# Patient Record
Sex: Female | Born: 1940 | Race: Asian | Hispanic: No | Marital: Married | Smoking: Never smoker
Health system: Southern US, Community
[De-identification: ages and names within clinical notes are randomized; demographics above are authoritative.]

## PROBLEM LIST (undated history)

## (undated) DIAGNOSIS — E785 Hyperlipidemia, unspecified: Secondary | ICD-10-CM

## (undated) DIAGNOSIS — E119 Type 2 diabetes mellitus without complications: Secondary | ICD-10-CM

## (undated) DIAGNOSIS — R42 Dizziness and giddiness: Secondary | ICD-10-CM

## (undated) HISTORY — DX: Type 2 diabetes mellitus without complications: E11.9

## (undated) HISTORY — DX: Dizziness and giddiness: R42

## (undated) HISTORY — DX: Hyperlipidemia, unspecified: E78.5

---

## 2016-01-27 LAB — HM COLONOSCOPY

## 2016-05-31 ENCOUNTER — Ambulatory Visit: Payer: Self-pay | Admitting: Family Medicine

## 2016-06-02 ENCOUNTER — Ambulatory Visit (INDEPENDENT_AMBULATORY_CARE_PROVIDER_SITE_OTHER): Payer: Medicare Other | Admitting: Primary Care

## 2016-06-02 ENCOUNTER — Encounter: Payer: Self-pay | Admitting: Primary Care

## 2016-06-02 VITALS — BP 122/76 | HR 77 | Temp 98.4°F | Ht 60.25 in | Wt 96.8 lb

## 2016-06-02 DIAGNOSIS — Z794 Long term (current) use of insulin: Secondary | ICD-10-CM | POA: Diagnosis not present

## 2016-06-02 DIAGNOSIS — E119 Type 2 diabetes mellitus without complications: Secondary | ICD-10-CM | POA: Diagnosis not present

## 2016-06-02 DIAGNOSIS — R42 Dizziness and giddiness: Secondary | ICD-10-CM | POA: Diagnosis not present

## 2016-06-02 DIAGNOSIS — E785 Hyperlipidemia, unspecified: Secondary | ICD-10-CM | POA: Insufficient documentation

## 2016-06-02 LAB — LIPID PANEL
CHOLESTEROL: 154 mg/dL (ref 0–200)
HDL: 68.1 mg/dL (ref >39.00)
LDL CALC: 73 mg/dL (ref 0–99)
NonHDL: 86.07
TRIGLYCERIDES: 65 mg/dL (ref 0.0–149.0)
Total CHOL/HDL Ratio: 2
VLDL: 13 mg/dL (ref 0.0–40.0)

## 2016-06-02 LAB — BASIC METABOLIC PANEL
BUN: 17 mg/dL (ref 6–23)
CO2: 29 meq/L (ref 19–32)
Calcium: 9.5 mg/dL (ref 8.4–10.5)
Chloride: 103 mEq/L (ref 96–112)
Creatinine, Ser: 0.56 mg/dL (ref 0.40–1.20)
GFR: 112.53 mL/min (ref >60.00)
GLUCOSE: 183 mg/dL — AB (ref 70–99)
POTASSIUM: 4 meq/L (ref 3.5–5.1)
Sodium: 139 mEq/L (ref 135–145)

## 2016-06-02 LAB — HEMOGLOBIN A1C: HEMOGLOBIN A1C: 7.9 % — AB (ref 4.6–6.5)

## 2016-06-02 NOTE — Assessment & Plan Note (Signed)
Intermittent, occurring twice weekly. Does not seem to be vertigo, orthostasis, dehydration. No falls. She is to notify me if the dizziness becomes more frequent or bothersome.

## 2016-06-02 NOTE — Progress Notes (Signed)
Pre visit review using our clinic review tool, if applicable. No additional management support is needed unless otherwise documented below in the visit note. 

## 2016-06-02 NOTE — Patient Instructions (Signed)
?? ???? ????? ??? ??? ? ??? ???? ??????.  ?? ?? ???? ? ? ??????.  ?? ?????   Chan ????? ??? ?? ???, ??? ?? ??? ?? ? ?? ? ????.  ??? ?? ???? ???? ??? ?? ?? ?????.  ??? ?????! [facility] [place]? ?? ?? ?????! oneul dangnyobyeong-gwa kolleseutelol suchileul hwag-inhal su issdolog silheomsil-e deulleojusibsio.  chuga yag-eul bochunghaeya hal ttae allyeojusibsio.  naneun nyuyog-eissneun Chan bagsalobuteo gilog-eul eod-eul geos-igo, najung-e dasi mannago sip-eul ttae yeonlag hal geos-ibnida.  gung-geumhan jeom-i iss-eusimyeon eonjedeunji imeil ttoneun jeonhwa haejusibsio.  mannaseo bangabseubnida! [place] osin geos-eul hwan-yeonghabnida!    Please stop by the lab today so we can check your diabetes and cholesterol levels.  Please notify me when you need additional refills of your medications.  I will obtain your records from Dr. Johny Drillinghan in [place], and will later be in touch regarding when I'd like to see you again.  Please don't hesitate to e-mail or call me with any questions.  It was very nice to meet you! [facility]!

## 2016-06-02 NOTE — Assessment & Plan Note (Signed)
Diagnosed years ago. Currently managed on the bedside XL 5, metformin 850 mg twice a day, and Humulin NPH 8 units twice a day. Endorses normal A1c in September 2016. We'll obtain records from [place]. Will also obtain an A1c, BMP, and lipids today. Managed on statin, no ace. Will obtain records in regards to immunizations.

## 2016-06-02 NOTE — Progress Notes (Signed)
   Subjective:    Patient ID: Lorne SkeensJeong Ja Serena, female    DOB: 1941/06/08, 75 y.o.   MRN: [id number]  HPI  Ms. Nedra HaiLee is a 75 year old female who presents today to establish care and discuss the problems mentioned below. Will obtain old records.She does not speak [place], her son is present and is translating.  1) Type 2 Diabetes: Diagnosed in her 50's. Currently managed on Glipizide XL 5 mg, Metformin 850 mg BID, and Humulin NPH 8 units BID. Her last blood draw was in September 2016, she endorses it was normal. Currently managed on ACE and statin.   2) Hyperlipidemia: Diagnosed years ago. Currently managed on Simvastatin 20 mg.   3) Dizziness: Occurs twice weekly on average. She will experience dizziness with both resting and with activity. She applies pressure to her temples in her head with improvement. She will also experience a sharp pain just prior to the dizziness. Her dizziness will last for a few seconds. Denies falls, doesn't feel as though the room is spinning.  Review of Systems  Constitutional: Negative for unexpected weight change.  Respiratory: Negative for shortness of breath.   Cardiovascular: Negative for chest pain.  Neurological: Negative for numbness and headaches.       Occasional dizziness       Past Medical History  Diagnosis Date  . Type 2 diabetes mellitus (HCC)   . Hyperlipidemia   . Dizziness      Social History   Social History  . Marital Status: Married    Spouse Name: N/A  . Number of Children: N/A  . Years of Education: N/A   Occupational History  . Not on file.   Social History Main Topics  . Smoking status: Never Smoker   . Smokeless tobacco: Not on file  . Alcohol Use: No  . Drug Use: No  . Sexual Activity: Not on file   Other Topics Concern  . Not on file   Social History Narrative   Moved from [place].   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   Enjoys reading books, spending time with family.    No past surgical history on  file.  Family History  Problem Relation Age of Onset  . Lung cancer Father   . Diabetes Sister     No Known Allergies  No current outpatient prescriptions on file prior to visit.   No current facility-administered medications on file prior to visit.    BP 122/76 mmHg  Pulse 77  Temp(Src) 98.4 F (36.9 C) (Oral)  Ht 5' 0.25" (1.53 m)  Wt 96 lb 12.8 oz (43.908 kg)  BMI 18.76 kg/m2  SpO2 99%    Objective:   Physical Exam  Constitutional: She is oriented to person, place, and time. She appears well-nourished.  Neck: Neck supple.  Cardiovascular: Normal rate and regular rhythm.   Pulmonary/Chest: Effort normal and breath sounds normal.  Neurological: She is alert and oriented to person, place, and time.  Skin: Skin is warm and dry.  Psychiatric: She has a normal mood and affect.          Assessment & Plan:

## 2016-06-02 NOTE — Assessment & Plan Note (Signed)
Managed on simvastatin 20 mg tablets. Continue same. We'll obtain records for last lipids, lipid panel pending today.

## 2016-06-13 DIAGNOSIS — E119 Type 2 diabetes mellitus without complications: Secondary | ICD-10-CM | POA: Diagnosis not present

## 2016-09-14 DIAGNOSIS — E113293 Type 2 diabetes mellitus with mild nonproliferative diabetic retinopathy without macular edema, bilateral: Secondary | ICD-10-CM | POA: Diagnosis not present

## 2016-10-07 ENCOUNTER — Other Ambulatory Visit: Payer: Self-pay

## 2016-10-07 MED ORDER — ONETOUCH DELICA LANCETS FINE MISC: 5 refills | Status: DC

## 2016-10-07 NOTE — Telephone Encounter (Signed)
Pts son request refill onetouch delica lancets to [facility]; pt test bid. Last seen 06/02/16. Refilled per protocol. pts son will ck with pharmacy.

## 2016-11-29 ENCOUNTER — Other Ambulatory Visit: Payer: Self-pay

## 2016-11-29 NOTE — Telephone Encounter (Signed)
Holly Trevino left v/m requesting rx for qwikpen for NPH insulin to [facility]. Pt last seen 06/02/16;was to get records from [place]. Is it OK to refill.

## 2016-11-29 NOTE — Telephone Encounter (Signed)
Holly Trevino Frankland called back and requested refill insulin to [facility] on 11/30/16.

## 2016-12-01 ENCOUNTER — Other Ambulatory Visit: Payer: Self-pay | Admitting: Primary Care

## 2016-12-01 DIAGNOSIS — E119 Type 2 diabetes mellitus without complications: Secondary | ICD-10-CM

## 2016-12-01 DIAGNOSIS — Z794 Long term (current) use of insulin: Principal | ICD-10-CM

## 2016-12-01 MED ORDER — INSULIN PEN NEEDLE 31G X 8 MM MISC: 11 refills | Status: DC

## 2016-12-01 MED ORDER — INSULIN ISOPHANE HUMAN 100 UNIT/ML KWIKPEN: PEN_INJECTOR | SUBCUTANEOUS | 11 refills | Status: DC

## 2016-12-01 NOTE — Telephone Encounter (Signed)
Please call Onalee HuaDavid at [phone] when rx is called in to pharmacy.

## 2016-12-01 NOTE — Addendum Note (Signed)
Addended by: Tawnya CrookSAMBATH, English Tomer on: 12/01/2016 03:54 PM   Modules accepted: Orders

## 2016-12-01 NOTE — Telephone Encounter (Signed)
Spoken to patient's son and was notified that patient is using Humulin kwikpen  Taking 8 units into the skin 2 times daily before meals.  Patient's read of the box that it is the kwikpen not the vial. Sorry for the confusion.

## 2016-12-01 NOTE — Telephone Encounter (Signed)
Please call patient and verify exactly which insulin he's needing. What does his current prescription read? Humulin? Novolin? From his chat it looks like he has the vial. Is he wanting the pens?

## 2016-12-01 NOTE — Telephone Encounter (Signed)
Patient's son,David,called.  He's asking about the medication.  Patient is out of medication.

## 2017-01-29 ENCOUNTER — Emergency Department (HOSPITAL_COMMUNITY)
Admission: EM | Admit: 2017-01-29 | Discharge: 2017-01-29 | Disposition: A | Discharge status: Home / Self Care | Payer: Medicare Other | Attending: Orthopedic Surgery | Admitting: Orthopedic Surgery

## 2017-01-29 ENCOUNTER — Emergency Department (HOSPITAL_COMMUNITY): Payer: Medicare Other

## 2017-01-29 ENCOUNTER — Encounter (HOSPITAL_COMMUNITY): Payer: Self-pay | Admitting: *Deleted

## 2017-01-29 DIAGNOSIS — W000XXA Fall on same level due to ice and snow, initial encounter: Secondary | ICD-10-CM | POA: Insufficient documentation

## 2017-01-29 DIAGNOSIS — S99911A Unspecified injury of right ankle, initial encounter: Secondary | ICD-10-CM | POA: Diagnosis present

## 2017-01-29 DIAGNOSIS — Z79899 Other long term (current) drug therapy: Secondary | ICD-10-CM | POA: Insufficient documentation

## 2017-01-29 DIAGNOSIS — S82401A Unspecified fracture of shaft of right fibula, initial encounter for closed fracture: Secondary | ICD-10-CM | POA: Insufficient documentation

## 2017-01-29 DIAGNOSIS — Z794 Long term (current) use of insulin: Secondary | ICD-10-CM | POA: Diagnosis not present

## 2017-01-29 DIAGNOSIS — S82201A Unspecified fracture of shaft of right tibia, initial encounter for closed fracture: Secondary | ICD-10-CM | POA: Insufficient documentation

## 2017-01-29 DIAGNOSIS — S82301A Unspecified fracture of lower end of right tibia, initial encounter for closed fracture: Secondary | ICD-10-CM

## 2017-01-29 DIAGNOSIS — Y939 Activity, unspecified: Secondary | ICD-10-CM | POA: Insufficient documentation

## 2017-01-29 DIAGNOSIS — Y999 Unspecified external cause status: Secondary | ICD-10-CM | POA: Diagnosis not present

## 2017-01-29 DIAGNOSIS — S82891A Other fracture of right lower leg, initial encounter for closed fracture: Secondary | ICD-10-CM

## 2017-01-29 DIAGNOSIS — Y929 Unspecified place or not applicable: Secondary | ICD-10-CM | POA: Insufficient documentation

## 2017-01-29 DIAGNOSIS — E119 Type 2 diabetes mellitus without complications: Secondary | ICD-10-CM | POA: Insufficient documentation

## 2017-01-29 DIAGNOSIS — S82401B Unspecified fracture of shaft of right fibula, initial encounter for open fracture type I or II: Secondary | ICD-10-CM | POA: Diagnosis not present

## 2017-01-29 DIAGNOSIS — S82201B Unspecified fracture of shaft of right tibia, initial encounter for open fracture type I or II: Secondary | ICD-10-CM | POA: Diagnosis not present

## 2017-01-29 DIAGNOSIS — S82831A Other fracture of upper and lower end of right fibula, initial encounter for closed fracture: Secondary | ICD-10-CM | POA: Diagnosis not present

## 2017-01-29 DIAGNOSIS — S82241A Displaced spiral fracture of shaft of right tibia, initial encounter for closed fracture: Secondary | ICD-10-CM | POA: Diagnosis not present

## 2017-01-29 LAB — CBG MONITORING, ED: Glucose-Capillary: 227 mg/dL — ABNORMAL HIGH (ref 65–99)

## 2017-01-29 MED ORDER — FENTANYL CITRATE (PF) 100 MCG/2ML IJ SOLN
100.0000 ug | Freq: Once | INTRAMUSCULAR | Status: DC
  Filled 2017-01-29: qty 2

## 2017-01-29 MED ORDER — HYDROCODONE-ACETAMINOPHEN 5-325 MG PO TABS: 1.0000 | ORAL_TABLET | Freq: Four times a day (QID) | ORAL | 0 refills | Status: DC | PRN

## 2017-01-29 NOTE — ED Provider Notes (Signed)
[facility]EMER[facility]ENCY DEPT Provider Note   CSN: [id number] Arrival date & time: 01/29/17  1554     History   Chief Complaint Chief Complaint  Patient presents with  . Fall  . Ankle Injury    HPI Holly Trevino is a 76 y.o. female.  HPI 76 year old female presenting after she slipped and fell on black ice around 10 AM this morning. She had sudden onset right ankle pain. She denies hitting her head or loss consciousness. She denies pain anywhere else. She first presented to [facility] and had an x-ray and was told that she had a both bone ankle fracture and needed to come here for further evaluation. She denies pain in any other extremity. Denies hip or knee pain on the right side. She denies back pain or headache or neck pain. She denies any numbness or weakness. She states she had no chest pain, shortness of breath, lightheadedness or dizziness prior to the fall.   Past Medical History:  Diagnosis Date  . Dizziness   . Hyperlipidemia   . Type 2 diabetes mellitus [facility])     Patient Active Problem List   Diagnosis Date Noted  . Type 2 diabetes mellitus without complication, with long-term current use of insulin (HCC) 06/02/2016  . Hyperlipidemia 06/02/2016  . Dizziness 06/02/2016    History reviewed. No pertinent surgical history.  OB History    No data available       Home Medications    Prior to Admission medications   Medication Sig Start Date End Date Taking? Authorizing Provider  glipiZIDE (GLUCOTROL XL) 5 MG 24 hr tablet Take 5 mg by mouth daily with breakfast.  04/26/16   Historical Provider, MD  HYDROcodone-acetaminophen (NORCO/VICODIN) 5-325 MG tablet Take 1 tablet by mouth every 6 (six) hours as needed. 01/29/17   Mieshia Pepitone [place] Rula Keniston, MD  Insulin NPH, Human,, Isophane, (HUMULIN N KWIKPEN) 100 UNIT/ML Kiwkpen Inject 8 units into the skin twice daily before meals. 12/01/16   Doreene Nest, NP  Insulin Pen Needle 31G X 8 MM MISC Used as directed to inject Humulin  insulin 2 times daily before meals. 12/01/16   Doreene Nest, NP  lisinopril (PRINIVIL,ZESTRIL) 10 MG tablet Take 10 mg by mouth daily.    Historical Provider, MD  metFORMIN (GLUCOPHAGE) 850 MG tablet Take 850 mg by mouth 2 (two) times daily with a meal.    Historical Provider, MD  [facility] DELICA LANCETS FINE MISC Check blood sugar twice a day and as directed; insulin treatment Dx E11.9 10/07/16   Doreene Nest, NP  simvastatin (ZOCOR) 20 MG tablet Take 20 mg by mouth daily.    Historical Provider, MD    Family History Family History  Problem Relation Age of Onset  . Lung cancer Father   . Diabetes Sister     Social History Social History  Substance Use Topics  . Smoking status: Never Smoker  . Smokeless tobacco: Not on file  . Alcohol use No     Allergies   Patient has no known allergies.   Review of Systems Review of Systems  Constitutional: Negative for chills and fever.  HENT: Negative for ear pain and sore throat.   Eyes: Negative for pain and visual disturbance.  Respiratory: Negative for cough and shortness of breath.   Cardiovascular: Negative for chest pain and palpitations.  Gastrointestinal: Negative for abdominal pain and vomiting.  Genitourinary: Negative for dysuria and hematuria.  Musculoskeletal: Negative for arthralgias, back pain and neck  pain.  Skin: Negative for color change and rash.  Neurological: Negative for seizures and syncope.  All other systems reviewed and are negative.    Physical Exam Updated Vital Signs BP 102/56   Pulse 90   Temp 98.5 F (36.9 C) (Oral)   Resp 16   SpO2 97%   Physical Exam  Constitutional: She is oriented to person, place, and time. She appears well-developed and well-nourished. No distress.  HENT:  Head: Normocephalic and atraumatic.  Eyes: Conjunctivae are normal.  Neck: Normal range of motion. Neck supple.  Cardiovascular: Normal rate and regular rhythm.   No murmur heard. Pulmonary/Chest: Effort  normal and breath sounds normal. No respiratory distress.  Abdominal: Soft. There is no tenderness.  Musculoskeletal: She exhibits no edema.       Right hip: She exhibits normal range of motion, no tenderness, no bony tenderness and no deformity.       Right knee: She exhibits normal range of motion and no deformity. No tenderness found.       Right ankle: She exhibits decreased range of motion, swelling and deformity. Tenderness. Lateral malleolus and medial malleolus tenderness found.       Cervical back: She exhibits no tenderness and no pain.       Thoracic back: She exhibits no tenderness and no pain.       Lumbar back: She exhibits no tenderness and no pain.  2+ DP pulses bl. Sensation and motor function intact in RLE.  Neurological: She is alert and oriented to person, place, and time. She has normal strength. No cranial nerve deficit or sensory deficit. GCS eye subscore is 4. GCS verbal subscore is 5. GCS motor subscore is 6.  Skin: Skin is warm and dry. Capillary refill takes less than 2 seconds.  Psychiatric: She has a normal mood and affect.  Nursing note and vitals reviewed.    ED Treatments / Results  Labs (all labs ordered are listed, but only abnormal results are displayed) Labs Reviewed  CBG MONITORING, ED - Abnormal; Notable for the following:       Result Value   Glucose-Capillary 227 (*)    All other components within normal limits    EKG  EKG Interpretation None       Radiology Dg Tibia/fibula Right  Result Date: 01/29/2017 CLINICAL DATA:  Right leg fractures.  Initial encounter. EXAM: RIGHT TIBIA AND FIBULA - 2 VIEW COMPARISON:  None. FINDINGS: Spiral fracture of the distal tibial diaphysis. There is questionable extension to the plafond and on contemporaneous ankle exam. Diaphysis fracture displacement is mild at up to 25% laterally. Nondisplaced coronal oblique fracture of the distal fibular diaphysis. Osteopenia. IMPRESSION: Distal tibial and fibular  diaphysis fractures as described. Electronically Signed   By: Marnee SpringJonathon  Watts M.D.   On: 01/29/2017 18:42   Dg Ankle Complete Right  Result Date: 01/29/2017 CLINICAL DATA:  Fall on ice today.  Fracture.  Initial encounter. EXAM: RIGHT ANKLE - COMPLETE 3+ VIEW COMPARISON:  None. FINDINGS: There is a spiral fracture of the distal tibial diaphysis with 25% lateral displacement. Questionable branching fracture plane extending to the plafond, without articular surface displacement. Coronal oblique fracture of the distal fibular diaphysis with mild apex ventral angulation. Osteopenia. IMPRESSION: 1. Mildly displaced spiral fracture of the distal tibial diaphysis. Questionable extension to the plafond. 2. Nondisplaced distal fibular diaphysis fracture. Electronically Signed   By: Marnee SpringJonathon  Watts M.D.   On: 01/29/2017 18:40   Ct Ankle Right Wo Contrast  Result  Date: 01/29/2017 CLINICAL DATA:  Fractures of the distal tibia and fibula after slip and fall on the ice this morning. EXAM: CT OF THE RIGHT ANKLE WITHOUT CONTRAST TECHNIQUE: Multidetector CT imaging of the right ankle was performed according to the standard protocol. Multiplanar CT image reconstructions were also generated. COMPARISON:  Right ankle 01/29/2017 FINDINGS: Bones/Joint/Cartilage Oblique fracture of the metadiaphysis of the distal right tibia with about 1 cm lateral displacement and 8 mm posterior displacement of the distal fracture fragment. There is an additional fracture line across the base of the medial malleolus of the right ankle without displacement. The fracture line extends to the tibiotalar joint surface. Mild widening of the joint space may indicate ligamentous injury. Oblique comminuted fracture of the distal right fibular shaft with extension to the tibia fibular joint. Comminuted fractures of the tip of the fibula consistent with avulsion of the lateral malleolus. Fracture lines extends to the talofibular joint. Ligaments Suboptimally  assessed by CT. Muscles and Tendons Limited evaluation. Appear grossly intact. MRI would be more suitable for evaluation of muscles and tendons. Soft tissues Diffuse soft tissue edema about the ankle. No loculated collections. IMPRESSION: Multiple fractures of the distal tibia and fibula as described above. Diffuse soft tissue edema. Electronically Signed   By: Burman Nieves M.D.   On: 01/29/2017 22:31    Procedures Procedures (including critical care time)  Medications Ordered in ED Medications  fentaNYL (SUBLIMAZE) injection 100 mcg (100 mcg Intravenous Not Given 01/29/17 1937)     Initial Impression / Assessment and Plan / ED Course  I have reviewed the triage vital signs and the nursing notes.  Pertinent labs & imaging results that were available during my care of the patient were reviewed by me and considered in my medical decision making (see chart for details).    76 year old female who slipped on black ice earlier fell and has right tib-fib fracture. Appears to be a mechanical fall. Denies any lightheadedness, dizziness, chest pain, shortness of breath prior to the fall. She hasn't had any other symptoms other than right ankle pain since then. She fell onto her bottom but denies low back or pelvic pain.  Full range of motion of the right knee and hip. Deformity to R ankle. Pulses, sensation and motor function intact. Ortho tech applied splint at the bedside Orthopedics consult for the right ankle fracture. Dr. Dion Saucier saw the pt in the ED and discussed the possibility of surgery however at this time she is declining. Dr Dion Saucier would like a CT scan of the ankle to assist his decision about possible over in the future. She will follow-up in his clinic on Wednesday. She was given crutches and she will may remain nonweightbearing to the right ankle until follow-up with orthopedics. She was given a DME prescription for a walker to assist with her nonweightbearing. Prescription for Norco given  for pain. She remained hemodynamically stable while in the emergency department. Strict return precautions given patient was discharged home in good condition into the care of her son.  Patient care discussed and supervised by my attending, Dr. Hyacinth Meeker. Azalia Bilis, MD   Final Clinical Impressions(s) / ED Diagnoses   Final diagnoses:  Closed fracture of right ankle, initial encounter    New Prescriptions New Prescriptions   HYDROCODONE-ACETAMINOPHEN (NORCO/VICODIN) 5-325 MG TABLET    Take 1 tablet by mouth every 6 (six) hours as needed.     Keya Wynes [place] Min Collymore, MD 01/29/17 1610    Eber Hong, MD 02/08/17 1014

## 2017-01-29 NOTE — Consult Note (Signed)
ORTHOPAEDIC CONSULTATION  REQUESTING PHYSICIAN: Eber HongBrian Miller, MD  Chief Complaint: Right ankle pain  HPI: Holly Trevino is a 76 y.o. female who complains of  acute right lower leg pain after she slipped on ice. [facility], had x-rays taken, then referred to this emergency room. Acute severe pain unable to bear weight, worse with movement, better with resting and splinting. She has already been splinted. Denies previous fracture. Normally walks fairly significant amount, at least daily for exercise after meals.  Past Medical History:  Diagnosis Date  . Dizziness   . Hyperlipidemia   . Type 2 diabetes mellitus (HCC)    History reviewed. No pertinent surgical history. Social History   Social History  . Marital status: Married    Spouse name: N/A  . Number of children: N/A  . Years of education: N/A   Social History Main Topics  . Smoking status: Never Smoker  . Smokeless tobacco: None  . Alcohol use No  . Drug use: No  . Sexual activity: Not Asked   Other Topics Concern  . None   Social History Narrative   Moved from [place].   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   Enjoys reading books, spending time with family.   Family History  Problem Relation Age of Onset  . Lung cancer Father   . Diabetes Sister    No Known Allergies   Positive ROS: All other systems have been reviewed and were otherwise negative with the exception of those mentioned in the HPI and as above.  Physical Exam: General: Alert, no acute distress Cardiovascular: No pedal edema Respiratory: No cyanosis, no use of accessory musculature GI: No organomegaly, abdomen is soft and non-tender Skin: No lesions in the area of chief complaint Neurologic: Sensation intact distally Psychiatric: Patient is competent for consent with normal mood and affect Lymphatic: No axillary or cervical lymphadenopathy  MUSCULOSKELETAL: Right ankle has some soft tissue swelling and pain to palpation  diffusely, did not remove her splint, sensation intact throughout her toes, rotational alignment looks fair although she may be slightly externally rotated. EHL and FHL are intact. She is nontender otherwise.  Assessment: Right distal tibia and fibula fracture, acute, with mild displacement and a 76 year old active [place] woman with risk factors including diabetes    Plan: This is an acute severe injury and threatened her long-term function. I've recommended consideration for surgical intervention although she herself is really not interested in surgery. She wants to treat this with closed management. I think that she will heal, and certainly has risk factors on considering surgery, although malunion and rotational malalignment as well as persistent valgus may be a long-term problem, however she is going to give all of this consideration.  I discussed the options of admission versus discharge home, and she really was to be discharged home. Amount of close follow-up with her on Wednesday and continue the discussion, she may elect for surgical intervention on a delayed basis in an elective fashion, however she is currently inclined towards nonsurgical management. I have discussed the risks of malunion and nonunion as well as permanent gait deformity and dysfunction, which are really risk factors both with and without surgery, however I do think that surgery offers her the best odds at achieving the most normal function. She understands but is currently planning on nonsurgical management. All of this was done through her son who is translating.  Nonweightbearing right lower extremity, I will also get a CAT scan  to evaluate the intra-articular displacement of the distal crack, and she'll continue the splint, use a walker or crutches but more likely a walker, and I'll see her on Wednesday. Eulas Post, MD     Eulas Post, MD Cell [phone]   01/29/2017 9:16 PM

## 2017-01-29 NOTE — ED Notes (Signed)
Patient transported to CT 

## 2017-01-29 NOTE — ED Notes (Signed)
Pt refused IV and pain medication at this time.

## 2017-01-29 NOTE — ED Provider Notes (Signed)
I saw and evaluated the patient, reviewed the resident's note and I agree with the findings and plan.  Pertinent History: The patient is a 76 year old female, slipped on black ice coming down the stairs today, she had acute onset of pain in her right ankle with associated swelling. [facility] has [facility] where she had x-ray showing possible fracture, sent to the emergency department.  Pertinent Exam findings: On exam the patient is in no acute distress however her right lower extremity is significantly abnormal with swelling around the distal tibia and fibula. She does have intact pulses. This area is swollen and slightly deformed.  I personally viewed and interpreted the x-ray of the right lower extremity at the tibia and fibula which shows a spiral fracture which is mildly displaced of the distal tibia as well as a nondisplaced diaphyseal distal fibular fracture. Care was discussed with the orthopedist, he will give recommendations.  Stirrup and posterior placed while awaiting instruction from Dr. Dion SaucierLandau.    Final diagnoses:  Closed fracture of right ankle, initial encounter      Eber HongBrian Dyanne Yorks, MD 02/08/17 1013

## 2017-01-29 NOTE — ED Notes (Signed)
Redness noted at right ankle with DP left greater than right.

## 2017-01-29 NOTE — ED Triage Notes (Signed)
Pt reports slipping on black ice today. [facility] to [facility] and had [facility]ray done of right ankle and sent here due to + fracture. +pedal pulse and able to move digits.

## 2017-01-29 NOTE — Progress Notes (Signed)
Orthopedic Tech Progress Note Patient Details:  Holly Trevino May 29, 1941 [id number]  Ortho Devices Type of Ortho Device: Ace wrap, Stirrup splint, Post (short leg) splint Ortho Device/Splint Location: RLE Ortho Device/Splint Interventions: Ordered, Application   Jennye MoccasinHughes, [name] 01/29/2017, 7:37 PM

## 2017-01-29 NOTE — ED Notes (Signed)
Pt given crutches, demonstrated appropriate use.

## 2017-01-29 NOTE — ED Notes (Signed)
Son request pain meds and MD Page aware. States he is waiting for ortho to determine route. Pt states no pain as long as she does not move her foot.

## 2017-01-29 NOTE — ED Notes (Addendum)
Pt Son expresses concern that pt is diabetic and needs to eat. CBG done and will inform MD.cbg 227mg /dl. Injured leg elevated on pillow per pt request.

## 2017-01-29 NOTE — ED Notes (Signed)
Pt arrives with Son from urgent care where she was told she had both bones of right lower leg broken infall today. Pt speaks [place] and Son is translating. Pt alert and responsive to questions.

## 2017-01-31 ENCOUNTER — Telehealth: Payer: Self-pay | Admitting: Surgery

## 2017-01-31 NOTE — Telephone Encounter (Signed)
ED CM left voice message with son Liliana ClineDavid Brannan [phone]  for return call concerning HH and DME arrangements

## 2017-01-31 NOTE — Care Management (Signed)
Fax Cover Sheet    To: [facility]  Comments: Referral  Holly Trevino DOB  August 10, 2041 HH PT/OT  From: Holly BickersWandalyn Lucienne Sawyers RN BSN CNOR             [facility] Case Manager  [phone] phone  [phone] Fax

## 2017-01-31 NOTE — Telephone Encounter (Signed)
ED CM received a return  call from Holly Trevino patient's son. [facility] but no CM orders were placed. CM explained the referral can be placed today.  Discussed the [facility] for [facility] son is agreeable. Verified contact information. Offered choice [facility] selected. [facility]Marland Kitchen. Patietnt also needs a rolling walker. Son states, he will come to thje ED today and   pick the walker up today. CM will leave at nurse first. Son Verbalized understanding teach back done.  Son was provided with ED CM contact information should any further questions or concerns should arise. No additional ED CM needs identified.

## 2017-02-01 DIAGNOSIS — S82831D Other fracture of upper and lower end of right fibula, subsequent encounter for closed fracture with routine healing: Secondary | ICD-10-CM | POA: Diagnosis not present

## 2017-02-02 DIAGNOSIS — S89301D Unspecified physeal fracture of lower end of right fibula, subsequent encounter for fracture with routine healing: Secondary | ICD-10-CM | POA: Diagnosis not present

## 2017-02-02 DIAGNOSIS — Z794 Long term (current) use of insulin: Secondary | ICD-10-CM | POA: Diagnosis not present

## 2017-02-02 DIAGNOSIS — E119 Type 2 diabetes mellitus without complications: Secondary | ICD-10-CM | POA: Diagnosis not present

## 2017-02-02 DIAGNOSIS — W19XXXD Unspecified fall, subsequent encounter: Secondary | ICD-10-CM | POA: Diagnosis not present

## 2017-02-02 DIAGNOSIS — S82301D Unspecified fracture of lower end of right tibia, subsequent encounter for closed fracture with routine healing: Secondary | ICD-10-CM | POA: Diagnosis not present

## 2017-02-02 DIAGNOSIS — I10 Essential (primary) hypertension: Secondary | ICD-10-CM | POA: Diagnosis not present

## 2017-02-02 DIAGNOSIS — E785 Hyperlipidemia, unspecified: Secondary | ICD-10-CM | POA: Diagnosis not present

## 2017-02-03 ENCOUNTER — Telehealth: Payer: Self-pay | Admitting: Primary Care

## 2017-02-03 DIAGNOSIS — S82301D Unspecified fracture of lower end of right tibia, subsequent encounter for closed fracture with routine healing: Secondary | ICD-10-CM | POA: Diagnosis not present

## 2017-02-03 DIAGNOSIS — I10 Essential (primary) hypertension: Secondary | ICD-10-CM | POA: Diagnosis not present

## 2017-02-03 DIAGNOSIS — E119 Type 2 diabetes mellitus without complications: Secondary | ICD-10-CM | POA: Diagnosis not present

## 2017-02-03 DIAGNOSIS — S89301D Unspecified physeal fracture of lower end of right fibula, subsequent encounter for fracture with routine healing: Secondary | ICD-10-CM | POA: Diagnosis not present

## 2017-02-03 DIAGNOSIS — W19XXXD Unspecified fall, subsequent encounter: Secondary | ICD-10-CM | POA: Diagnosis not present

## 2017-02-03 DIAGNOSIS — E785 Hyperlipidemia, unspecified: Secondary | ICD-10-CM | POA: Diagnosis not present

## 2017-02-03 DIAGNOSIS — Z794 Long term (current) use of insulin: Secondary | ICD-10-CM | POA: Diagnosis not present

## 2017-02-03 NOTE — Telephone Encounter (Signed)
Approved. Let me know if they need a written RX for the wheelchair, we can fax this.

## 2017-02-03 NOTE — Telephone Encounter (Signed)
Holly Trevino called to provide an update from her admission visit.  She is requesting a light weight wheelchair and PT 2x/wk for 3 weeks.  Can you please call her with a verbal order.  Pt had a fall last Sunday and fx'd right tib/fib.  She is not having surgery and is in a cast.  They want to work with her on strengthening and safety exercises.  She can be reached at [phone]

## 2017-02-03 NOTE — Telephone Encounter (Signed)
I called and left a detailed message on Cathy's voicemail that Jae DireKate authorized the PT and the light weight wheelchair. I stated that if she needs an order for the wheelchair faxed to callback and we will fax it.

## 2017-02-06 DIAGNOSIS — E119 Type 2 diabetes mellitus without complications: Secondary | ICD-10-CM | POA: Diagnosis not present

## 2017-02-06 DIAGNOSIS — E785 Hyperlipidemia, unspecified: Secondary | ICD-10-CM | POA: Diagnosis not present

## 2017-02-06 DIAGNOSIS — W19XXXD Unspecified fall, subsequent encounter: Secondary | ICD-10-CM | POA: Diagnosis not present

## 2017-02-06 DIAGNOSIS — S82301D Unspecified fracture of lower end of right tibia, subsequent encounter for closed fracture with routine healing: Secondary | ICD-10-CM | POA: Diagnosis not present

## 2017-02-06 DIAGNOSIS — I10 Essential (primary) hypertension: Secondary | ICD-10-CM | POA: Diagnosis not present

## 2017-02-06 DIAGNOSIS — S89301D Unspecified physeal fracture of lower end of right fibula, subsequent encounter for fracture with routine healing: Secondary | ICD-10-CM | POA: Diagnosis not present

## 2017-02-06 DIAGNOSIS — Z794 Long term (current) use of insulin: Secondary | ICD-10-CM | POA: Diagnosis not present

## 2017-02-06 NOTE — Telephone Encounter (Signed)
Order has been faxed to [facility] on 02/03/2017.  Fax # [phone]

## 2017-02-08 DIAGNOSIS — Z794 Long term (current) use of insulin: Secondary | ICD-10-CM | POA: Diagnosis not present

## 2017-02-08 DIAGNOSIS — W19XXXD Unspecified fall, subsequent encounter: Secondary | ICD-10-CM | POA: Diagnosis not present

## 2017-02-08 DIAGNOSIS — I10 Essential (primary) hypertension: Secondary | ICD-10-CM | POA: Diagnosis not present

## 2017-02-08 DIAGNOSIS — E785 Hyperlipidemia, unspecified: Secondary | ICD-10-CM | POA: Diagnosis not present

## 2017-02-08 DIAGNOSIS — E119 Type 2 diabetes mellitus without complications: Secondary | ICD-10-CM | POA: Diagnosis not present

## 2017-02-08 DIAGNOSIS — S89301D Unspecified physeal fracture of lower end of right fibula, subsequent encounter for fracture with routine healing: Secondary | ICD-10-CM | POA: Diagnosis not present

## 2017-02-08 DIAGNOSIS — S82301D Unspecified fracture of lower end of right tibia, subsequent encounter for closed fracture with routine healing: Secondary | ICD-10-CM | POA: Diagnosis not present

## 2017-02-08 DIAGNOSIS — S82831D Other fracture of upper and lower end of right fibula, subsequent encounter for closed fracture with routine healing: Secondary | ICD-10-CM | POA: Diagnosis not present

## 2017-02-09 ENCOUNTER — Ambulatory Visit: Payer: Medicare Other | Admitting: Primary Care

## 2017-02-13 DIAGNOSIS — E119 Type 2 diabetes mellitus without complications: Secondary | ICD-10-CM | POA: Diagnosis not present

## 2017-02-13 DIAGNOSIS — I10 Essential (primary) hypertension: Secondary | ICD-10-CM | POA: Diagnosis not present

## 2017-02-13 DIAGNOSIS — E785 Hyperlipidemia, unspecified: Secondary | ICD-10-CM | POA: Diagnosis not present

## 2017-02-13 DIAGNOSIS — W19XXXD Unspecified fall, subsequent encounter: Secondary | ICD-10-CM | POA: Diagnosis not present

## 2017-02-13 DIAGNOSIS — Z794 Long term (current) use of insulin: Secondary | ICD-10-CM | POA: Diagnosis not present

## 2017-02-13 DIAGNOSIS — S82301D Unspecified fracture of lower end of right tibia, subsequent encounter for closed fracture with routine healing: Secondary | ICD-10-CM | POA: Diagnosis not present

## 2017-02-13 DIAGNOSIS — S89301D Unspecified physeal fracture of lower end of right fibula, subsequent encounter for fracture with routine healing: Secondary | ICD-10-CM | POA: Diagnosis not present

## 2017-02-14 DIAGNOSIS — E119 Type 2 diabetes mellitus without complications: Secondary | ICD-10-CM | POA: Diagnosis not present

## 2017-02-14 DIAGNOSIS — W19XXXD Unspecified fall, subsequent encounter: Secondary | ICD-10-CM | POA: Diagnosis not present

## 2017-02-14 DIAGNOSIS — I10 Essential (primary) hypertension: Secondary | ICD-10-CM | POA: Diagnosis not present

## 2017-02-14 DIAGNOSIS — E785 Hyperlipidemia, unspecified: Secondary | ICD-10-CM | POA: Diagnosis not present

## 2017-02-14 DIAGNOSIS — Z794 Long term (current) use of insulin: Secondary | ICD-10-CM | POA: Diagnosis not present

## 2017-02-14 DIAGNOSIS — S82301D Unspecified fracture of lower end of right tibia, subsequent encounter for closed fracture with routine healing: Secondary | ICD-10-CM | POA: Diagnosis not present

## 2017-02-14 DIAGNOSIS — S89301D Unspecified physeal fracture of lower end of right fibula, subsequent encounter for fracture with routine healing: Secondary | ICD-10-CM | POA: Diagnosis not present

## 2017-02-15 ENCOUNTER — Ambulatory Visit (INDEPENDENT_AMBULATORY_CARE_PROVIDER_SITE_OTHER): Payer: Medicare Other | Admitting: Primary Care

## 2017-02-15 ENCOUNTER — Encounter: Payer: Self-pay | Admitting: Primary Care

## 2017-02-15 ENCOUNTER — Ambulatory Visit: Payer: Medicare Other | Admitting: Primary Care

## 2017-02-15 VITALS — BP 132/70 | HR 85 | Temp 98.4°F

## 2017-02-15 DIAGNOSIS — I10 Essential (primary) hypertension: Secondary | ICD-10-CM

## 2017-02-15 DIAGNOSIS — E785 Hyperlipidemia, unspecified: Secondary | ICD-10-CM | POA: Diagnosis not present

## 2017-02-15 DIAGNOSIS — E119 Type 2 diabetes mellitus without complications: Secondary | ICD-10-CM | POA: Diagnosis not present

## 2017-02-15 DIAGNOSIS — Z794 Long term (current) use of insulin: Secondary | ICD-10-CM | POA: Diagnosis not present

## 2017-02-15 LAB — COMPREHENSIVE METABOLIC PANEL
ALBUMIN: 4.1 g/dL (ref 3.5–5.2)
ALK PHOS: 65 U/L (ref 39–117)
ALT: 18 U/L (ref 0–35)
AST: 19 U/L (ref 0–37)
BILIRUBIN TOTAL: 0.7 mg/dL (ref 0.2–1.2)
BUN: 15 mg/dL (ref 6–23)
CALCIUM: 10.4 mg/dL (ref 8.4–10.5)
CHLORIDE: 97 meq/L (ref 96–112)
CO2: 30 mEq/L (ref 19–32)
CREATININE: 0.53 mg/dL (ref 0.40–1.20)
GFR: 119.68 mL/min (ref >60.00)
Glucose, Bld: 149 mg/dL — ABNORMAL HIGH (ref 70–99)
Potassium: 4.4 mEq/L (ref 3.5–5.1)
Sodium: 135 mEq/L (ref 135–145)
TOTAL PROTEIN: 7.1 g/dL (ref 6.0–8.3)

## 2017-02-15 LAB — HEMOGLOBIN A1C: Hgb A1c MFr Bld: 8 % — ABNORMAL HIGH (ref 4.6–6.5)

## 2017-02-15 MED ORDER — GLIPIZIDE ER 5 MG PO TB24: 5.0000 mg | ORAL_TABLET | Freq: Every day | ORAL | 3 refills | Status: DC

## 2017-02-15 MED ORDER — METFORMIN HCL 850 MG PO TABS: 850.0000 mg | ORAL_TABLET | Freq: Two times a day (BID) | ORAL | 3 refills | Status: DC

## 2017-02-15 MED ORDER — INSULIN ISOPHANE HUMAN 100 UNIT/ML KWIKPEN: PEN_INJECTOR | SUBCUTANEOUS | 11 refills | Status: DC

## 2017-02-15 MED ORDER — LISINOPRIL 10 MG PO TABS: 10.0000 mg | ORAL_TABLET | Freq: Every day | ORAL | 3 refills | Status: DC

## 2017-02-15 MED ORDER — SIMVASTATIN 20 MG PO TABS: 20.0000 mg | ORAL_TABLET | Freq: Every day | ORAL | 3 refills | Status: DC

## 2017-02-15 NOTE — Progress Notes (Signed)
Pre visit review using our clinic review tool, if applicable. No additional management support is needed unless otherwise documented below in the visit note. 

## 2017-02-15 NOTE — Assessment & Plan Note (Signed)
Check A1C today, refills of medications provided today. Managed on ACE and statin. Follow up in 6 months.

## 2017-02-15 NOTE — Patient Instructions (Addendum)
Complete lab work prior to leaving today. I will notify you of your results once received.   I will send refills of your diabetes medications once we receive your A1C result tomorrow.  Follow up in 6 months for your annual physical.  It was a pleasure to see you today!

## 2017-02-15 NOTE — Progress Notes (Signed)
   Subjective:    Patient ID: Holly Trevino, female    DOB: 1941/12/16, 76 y.o.   MRN: [id number]  HPI  Holly Trevino is a 76 year old female who presents today for follow up. Her son is with her today translating as she does not speak [place].  1) Type 2 Diabetes: Currently managed on Glipizide XL 5 mg, Humulin 8 units every morning, and Metformin 850 mg BID. Her last A1C was 7.9 in June 2017. She checks her sugars twice daily in the morning and evening which run 120's-130's. She denies weakness, dizziness, visual changes. She is not very active now as she recently fractured her right tib/fib. She is compliant to all of her medications and is needing refills.  Review of Systems  Constitutional: Negative for fatigue.  Respiratory: Negative for shortness of breath.   Cardiovascular: Negative for chest pain.  Neurological: Negative for dizziness, weakness and headaches.       Past Medical History:  Diagnosis Date  . Dizziness   . Hyperlipidemia   . Type 2 diabetes mellitus (HCC)      Social History   Social History  . Marital status: Married    Spouse name: N/A  . Number of children: N/A  . Years of education: N/A   Occupational History  . Not on file.   Social History Main Topics  . Smoking status: Never Smoker  . Smokeless tobacco: Never Used  . Alcohol use No  . Drug use: No  . Sexual activity: Not on file   Other Topics Concern  . Not on file   Social History Narrative   [place]   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   Enjoys reading books, spending time with family.    No past surgical history on file.  Family History  Problem Relation Age of Onset  . Lung cancer Father   . Diabetes Sister     No Known Allergies  Current Outpatient Prescriptions on File Prior to Visit  Medication Sig Dispense Refill  . HYDROcodone-acetaminophen (NORCO/VICODIN) 5-325 MG tablet Take 1 tablet by mouth every 6 (six) hours as needed. 15 tablet 0  . Insulin  Pen Needle 31G X 8 MM MISC Used as directed to inject Humulin insulin 2 times daily before meals. 100 each 11  . ONETOUCH DELICA LANCETS FINE MISC Check blood sugar twice a day and as directed; insulin treatment Dx E11.9 100 each 5   No current facility-administered medications on file prior to visit.     BP 132/70   Pulse 85   Temp 98.4 F (36.9 C) (Oral)   SpO2 97%    Objective:   Physical Exam  Constitutional: She appears well-nourished.  Neck: Neck supple.  Cardiovascular: Normal rate and regular rhythm.   Pulmonary/Chest: Effort normal and breath sounds normal.  Skin: Skin is warm and dry.          Assessment & Plan:

## 2017-02-20 ENCOUNTER — Encounter: Payer: Self-pay | Admitting: *Deleted

## 2017-02-22 DIAGNOSIS — S82831D Other fracture of upper and lower end of right fibula, subsequent encounter for closed fracture with routine healing: Secondary | ICD-10-CM | POA: Diagnosis not present

## 2017-03-08 DIAGNOSIS — S82831D Other fracture of upper and lower end of right fibula, subsequent encounter for closed fracture with routine healing: Secondary | ICD-10-CM | POA: Diagnosis not present

## 2017-03-09 ENCOUNTER — Telehealth: Payer: Self-pay | Admitting: Primary Care

## 2017-03-09 NOTE — Telephone Encounter (Signed)
Left pt message asking to call Allison back directly at [phone] to schedule AWV.+ labs with Lesia and CPE with PCP. °

## 2017-03-17 ENCOUNTER — Other Ambulatory Visit: Payer: Self-pay | Admitting: Primary Care

## 2017-03-17 DIAGNOSIS — E119 Type 2 diabetes mellitus without complications: Secondary | ICD-10-CM

## 2017-03-17 DIAGNOSIS — E785 Hyperlipidemia, unspecified: Secondary | ICD-10-CM

## 2017-03-17 DIAGNOSIS — Z794 Long term (current) use of insulin: Principal | ICD-10-CM

## 2017-03-17 MED ORDER — GLIPIZIDE ER 5 MG PO TB24: 5.0000 mg | ORAL_TABLET | Freq: Every day | ORAL | 3 refills | Status: DC

## 2017-03-17 MED ORDER — SIMVASTATIN 20 MG PO TABS: 20.0000 mg | ORAL_TABLET | Freq: Every day | ORAL | 3 refills | Status: DC

## 2017-03-17 NOTE — Telephone Encounter (Signed)
Refilled medications..prescription for simvastatin, glipizide

## 2017-03-21 ENCOUNTER — Telehealth: Payer: Self-pay | Admitting: Primary Care

## 2017-03-21 DIAGNOSIS — Z794 Long term (current) use of insulin: Principal | ICD-10-CM

## 2017-03-21 DIAGNOSIS — E119 Type 2 diabetes mellitus without complications: Secondary | ICD-10-CM

## 2017-03-21 MED ORDER — INSULIN ISOPHANE HUMAN 100 UNIT/ML KWIKPEN: PEN_INJECTOR | SUBCUTANEOUS | 2 refills | Status: DC

## 2017-03-21 MED ORDER — ONETOUCH VERIO IQ SYSTEM W/DEVICE KIT: PACK | 0 refills | Status: DC

## 2017-03-21 MED ORDER — ONETOUCH DELICA LANCETS FINE MISC: 1 refills | Status: DC

## 2017-03-21 NOTE — Telephone Encounter (Signed)
Received faxed refill request for Blood Glucose Monitoring Suppl (ONETOUCH VERIO IQ, Insulin NPH, Human,, Isophane, (HUMULIN N KWIKPEN) 100, and ONETOUCH DELICA LANCETS FINE MISC. Last prescribed on 02/22/2017.  Will change to mail order as requested.

## 2017-03-29 ENCOUNTER — Telehealth: Payer: Self-pay | Admitting: Primary Care

## 2017-03-29 ENCOUNTER — Other Ambulatory Visit: Payer: Self-pay

## 2017-03-29 DIAGNOSIS — Z794 Long term (current) use of insulin: Secondary | ICD-10-CM

## 2017-03-29 DIAGNOSIS — E785 Hyperlipidemia, unspecified: Secondary | ICD-10-CM

## 2017-03-29 DIAGNOSIS — E119 Type 2 diabetes mellitus without complications: Secondary | ICD-10-CM

## 2017-03-29 DIAGNOSIS — S82831D Other fracture of upper and lower end of right fibula, subsequent encounter for closed fracture with routine healing: Secondary | ICD-10-CM | POA: Diagnosis not present

## 2017-03-29 DIAGNOSIS — I10 Essential (primary) hypertension: Secondary | ICD-10-CM

## 2017-03-29 MED ORDER — LISINOPRIL 10 MG PO TABS: 10.0000 mg | ORAL_TABLET | Freq: Every day | ORAL | 3 refills | Status: DC

## 2017-03-29 MED ORDER — INSULIN PEN NEEDLE 31G X 8 MM MISC: 1 refills | Status: DC

## 2017-03-29 MED ORDER — GLIPIZIDE ER 5 MG PO TB24: 5.0000 mg | ORAL_TABLET | Freq: Every day | ORAL | 3 refills | Status: DC

## 2017-03-29 MED ORDER — SIMVASTATIN 20 MG PO TABS: 20.0000 mg | ORAL_TABLET | Freq: Every day | ORAL | 3 refills | Status: DC

## 2017-03-29 MED ORDER — GLUCOSE BLOOD VI STRP: ORAL_STRIP | 2 refills | Status: DC

## 2017-03-29 NOTE — Telephone Encounter (Signed)
Mr Duhart called back and is not going to be able to get pen needles from [facility] in time and wants to get from [facility]. Mr Urbanik will ck with [facility] until get from [facility].

## 2017-03-29 NOTE — Telephone Encounter (Signed)
Received faxed refill request to change to mail order. Send refill as requested.

## 2017-03-29 NOTE — Telephone Encounter (Signed)
Holly Trevino request refill pen needle to [facility] rx. Pt last seen 02/15/17. Refill done as requested by protocol.

## 2017-04-19 NOTE — Telephone Encounter (Signed)
Left pt message asking to call Allison back directly at [phone] to schedule AWV.+ labs with Lesia and CPE with PCP. °

## 2017-04-26 DIAGNOSIS — S82831D Other fracture of upper and lower end of right fibula, subsequent encounter for closed fracture with routine healing: Secondary | ICD-10-CM | POA: Diagnosis not present

## 2017-05-04 DIAGNOSIS — Z13820 Encounter for screening for osteoporosis: Secondary | ICD-10-CM | POA: Diagnosis not present

## 2017-05-04 DIAGNOSIS — M899 Disorder of bone, unspecified: Secondary | ICD-10-CM | POA: Diagnosis not present

## 2017-05-29 DIAGNOSIS — S82831D Other fracture of upper and lower end of right fibula, subsequent encounter for closed fracture with routine healing: Secondary | ICD-10-CM | POA: Diagnosis not present

## 2017-06-20 DIAGNOSIS — E119 Type 2 diabetes mellitus without complications: Secondary | ICD-10-CM | POA: Diagnosis not present

## 2017-06-20 LAB — HM DIABETES EYE EXAM

## 2017-07-12 ENCOUNTER — Other Ambulatory Visit: Payer: Self-pay | Admitting: Primary Care

## 2017-07-12 DIAGNOSIS — E119 Type 2 diabetes mellitus without complications: Secondary | ICD-10-CM

## 2017-07-12 DIAGNOSIS — Z794 Long term (current) use of insulin: Principal | ICD-10-CM

## 2017-07-12 DIAGNOSIS — E785 Hyperlipidemia, unspecified: Secondary | ICD-10-CM

## 2017-07-20 NOTE — Progress Notes (Signed)
Subjective:   Holly Trevino is a 76 y.o. female who presents for an Initial Medicare Annual Wellness Visit.  Pt speaks mostly [place], pts son translated during visit. Pt did not want to use a Optometrist.   Review of Systems    No ROS.  Medicare Wellness Visit. Additional risk factors are reflected in the social history.  Cardiac Risk Factors include: advanced age (>17mn, >>70women);dyslipidemia;diabetes mellitus     Objective:    Today's Vitals   07/25/17 0830  BP: 138/78  Pulse: 85  Resp: 18  SpO2: 97%  Weight: 104 lb 12.8 oz (47.5 kg)  Height: 5' 0.24" (1.53 m)   Body mass index is 20.31 kg/m.   Current Medications (verified) Outpatient Encounter Prescriptions as of 07/25/2017  Medication Sig  . Blood Glucose Monitoring Suppl (ONETOUCH VERIO IQ SYSTEM) w/Device KIT Use as instructed to test blood sugar up to 2 times daily.  .Marland KitchenglipiZIDE (GLUCOTROL XL) 5 MG 24 hr tablet Take 1 tablet (5 mg total) by mouth daily with breakfast.  . glucose blood (ONETOUCH VERIO) test strip Use as instructed to test blood sugar 2 times daily  . Insulin NPH, Human,, Isophane, (HUMULIN N KWIKPEN) 100 UNIT/ML Kiwkpen Inject 8 units into the skin 2 times daily.  . Insulin Pen Needle 31G X 8 MM MISC Used as directed to inject Humulin insulin 2 times daily before meals. Dx E11.9  . metFORMIN (GLUCOPHAGE) 850 MG tablet Take 1 tablet (850 mg total) by mouth 2 (two) times daily with a meal.  . ONETOUCH DELICA LANCETS FINE MISC Check blood sugar twice a day and as directed; insulin treatment Dx E11.9  . simvastatin (ZOCOR) 20 MG tablet Take 1 tablet (20 mg total) by mouth daily.  .Marland Kitchenlisinopril (PRINIVIL,ZESTRIL) 10 MG tablet Take 1 tablet (10 mg total) by mouth daily. (Patient not taking: Reported on 07/25/2017)  . [DISCONTINUED] HYDROcodone-acetaminophen (NORCO/VICODIN) 5-325 MG tablet Take 1 tablet by mouth every 6 (six) hours as needed.   No facility-administered encounter medications on file as of  07/25/2017.     Allergies (verified) Patient has no known allergies.   History: Past Medical History:  Diagnosis Date  . Dizziness   . Hyperlipidemia   . Type 2 diabetes mellitus ([facility]    History reviewed. No pertinent surgical history. Family History  Problem Relation Age of Onset  . Lung cancer Father   . Diabetes Sister    Social History   Occupational History  . Not on file.   Social History Main Topics  . Smoking status: Never Smoker  . Smokeless tobacco: Never Used  . Alcohol use No  . Drug use: No  . Sexual activity: Not on file    Tobacco Counseling Counseling given: Not Answered   Activities of Daily Living In your present state of health, do you have any difficulty performing the following activities: 07/25/2017  Hearing? N  Vision? N  Difficulty concentrating or making decisions? N  Walking or climbing stairs? N  Dressing or bathing? N  Doing errands, shopping? N  Preparing Food and eating ? N  Using the Toilet? N  Managing your Medications? N  Managing your Finances? N  Housekeeping or managing your Housekeeping? N  Some recent data might be hidden    Immunizations and Health Maintenance  There is no immunization history on file for this patient. Health Maintenance Due  Topic Date Due  . FOOT EXAM  08/09/1952  . OPHTHALMOLOGY EXAM  08/09/1952  .  TETANUS/TDAP  08/09/1961  . MAMMOGRAM  08/09/1992  . COLONOSCOPY  08/09/1992  . PNA vac Low Risk Adult (1 of 2 - PCV13) 08/10/2007    Patient Care Team: Pleas Koch, NP as PCP - General (Internal Medicine)  Indicate any recent Medical Services you may have received from other than [facility] providers in the past year (date may be approximate).     Assessment:   This is a routine wellness examination for [place]. Physical assessment deferred to PCP.  Hearing/Vision screen  Hearing Screening   125Hz  250Hz  500Hz  1000Hz  2000Hz  3000Hz  4000Hz  6000Hz  8000Hz   Right ear:   40 40 40  0    Left ear:    0 40 40  40    Vision Screening Comments: June 20118, [place].  Dietary issues and exercise activities discussed: Current Exercise Habits: Home exercise routine, Type of exercise: walking;strength training/weights, Time (Minutes): 15, Frequency (Times/Week): 4, Weekly Exercise (Minutes/Week): 60, Exercise limited by: None identified  Goals    . Maintain my blood sugars and A1C at a normal level.      Depression Screen PHQ 2/9 Scores 07/25/2017  PHQ - 2 Score 0    Fall Risk Fall Risk  07/25/2017  Falls in the past year? Yes  Number falls in past yr: 1  Injury with Fall? Yes  Risk Factor Category  High Fall Risk  Follow up Education provided;Falls prevention discussed    Cognitive Function: PLEASE NOTE: A Mini-Cog screen was completed. Maximum score is 20. A value of 0 denotes this part of Folstein MMSE was not completed or the patient failed this part of the Mini-Cog screening.   Mini-Cog Screening Orientation to Time - Max 5 pts Orientation to Place - Max 5 pts Registration - Max 3 pts Recall - Max 3 pts Language Repeat - Max 1 pts Language Follow 3 Step Command - Max 3 pts      Mini-Cog - 07/25/17 0841    Normal clock drawing test? yes   How many words correct? 3      MMSE - Mini Mental State Exam 07/25/2017  Orientation to time 5  Orientation to Place 4  Registration 3  Attention/ Calculation 0  Recall 3  Language- name 2 objects 0  Language- repeat 1  Language- follow 3 step command 3  Language- read & follow direction 0  Write a sentence 0  Copy design 0  Total score 19        Screening Tests Health Maintenance  Topic Date Due  . FOOT EXAM  08/09/1952  . OPHTHALMOLOGY EXAM  08/09/1952  . TETANUS/TDAP  08/09/1961  . MAMMOGRAM  08/09/1992  . COLONOSCOPY  08/09/1992  . PNA vac Low Risk Adult (1 of 2 - PCV13) 08/10/2007  . INFLUENZA VACCINE  07/26/2017  . HEMOGLOBIN A1C  08/15/2017  . DEXA SCAN  Completed      Plan:   Follow up with PCP as  directed.  I have personally reviewed and noted the following in the patient's chart:   . Medical and social history . Use of alcohol, tobacco or illicit drugs  . Current medications and supplements . Functional ability and status . Nutritional status . Physical activity . Advanced directives . List of other physicians . Vitals . Screenings to include cognitive, depression, and falls . Referrals and appointments  In addition, I have reviewed and discussed with patient certain preventive protocols, quality metrics, and best practice recommendations. A written personalized care plan for preventive services  as well as general preventive health recommendations were provided to patient.     Ree Edman, RN   07/25/2017

## 2017-07-20 NOTE — Progress Notes (Signed)
PCP notes:   Health maintenance: Foot exam - due. Opthalmology exam- son states this was done recently. I [facility] and they will fax her records.  Tdap - due. Son will consult insurance. Mammogram - due. Colonoscopy -  1 year ago per pt in [place], normal per pt. Unsure of practice where this was performed. PCV 13 - up to date per pt. Received the vaccines through Dr Carmelina Danehee Chon. Medical records release was faxed 06/02/2016, no records visible in chart.  Abnormal screenings: None.    Patient concerns:  Reviewed medication list, pt states that she does not take lisinopril, she is concerned this could be for her husband.   Nurse concerns: None.   Next PCP appt: 08/01/2017

## 2017-07-25 ENCOUNTER — Ambulatory Visit (INDEPENDENT_AMBULATORY_CARE_PROVIDER_SITE_OTHER): Payer: Medicare Other

## 2017-07-25 ENCOUNTER — Other Ambulatory Visit (INDEPENDENT_AMBULATORY_CARE_PROVIDER_SITE_OTHER): Payer: Medicare Other

## 2017-07-25 ENCOUNTER — Ambulatory Visit: Payer: Medicare Other

## 2017-07-25 ENCOUNTER — Encounter: Payer: Self-pay | Admitting: Primary Care

## 2017-07-25 ENCOUNTER — Other Ambulatory Visit: Payer: Medicare Other

## 2017-07-25 VITALS — BP 138/78 | HR 85 | Resp 18 | Ht 60.24 in | Wt 104.8 lb

## 2017-07-25 DIAGNOSIS — E119 Type 2 diabetes mellitus without complications: Secondary | ICD-10-CM | POA: Diagnosis not present

## 2017-07-25 DIAGNOSIS — E785 Hyperlipidemia, unspecified: Secondary | ICD-10-CM

## 2017-07-25 DIAGNOSIS — Z794 Long term (current) use of insulin: Secondary | ICD-10-CM

## 2017-07-25 DIAGNOSIS — Z0000 Encounter for general adult medical examination without abnormal findings: Secondary | ICD-10-CM | POA: Diagnosis not present

## 2017-07-25 LAB — LIPID PANEL
CHOL/HDL RATIO: 2
Cholesterol: 147 mg/dL (ref 0–200)
HDL: 62.3 mg/dL (ref >39.00)
LDL Cholesterol: 70 mg/dL (ref 0–99)
NONHDL: 84.27
Triglycerides: 72 mg/dL (ref 0.0–149.0)
VLDL: 14.4 mg/dL (ref 0.0–40.0)

## 2017-07-25 LAB — HEMOGLOBIN A1C: HEMOGLOBIN A1C: 7.2 % — AB (ref 4.6–6.5)

## 2017-07-25 NOTE — Progress Notes (Signed)
I reviewed health advisor's note, was available for consultation, and agree with documentation and plan.  

## 2017-07-25 NOTE — Patient Instructions (Signed)
[name] , Thank you for taking time to come for your Medicare Wellness Visit. I appreciate your ongoing commitment to your health goals. Please review the following plan we discussed and let me know if I can assist you in the future.   These are the goals we discussed: Goals    None      This is a list of the screening recommended for you and due dates:  Health Maintenance  Topic Date Due  . Complete foot exam   08/09/1952  . Eye exam for diabetics  08/09/1952  . Tetanus Vaccine  08/09/1961  . Mammogram  08/09/1992  . Colon Cancer Screening  08/09/1992  . Pneumonia vaccines (1 of 2 - PCV13) 08/10/2007  . Flu Shot  07/26/2017  . Hemoglobin A1C  08/15/2017  . DEXA scan (bone density measurement)  Completed    Preventive Care for Adults  A healthy lifestyle and preventive care can promote health and wellness. Preventive health guidelines for adults include the following key practices.  . A routine yearly physical is a good way to check with your health care provider about your health and preventive screening. It is a chance to share any concerns and updates on your health and to receive a thorough exam.  . Visit your dentist for a routine exam and preventive care every 6 months. Brush your teeth twice a day and floss once a day. Good oral hygiene prevents tooth decay and gum disease.  . The frequency of eye exams is based on your age, health, family medical history, use  of contact lenses, and other factors. Follow your health care provider's ecommendations for frequency of eye exams.  . Eat a healthy diet. Foods like vegetables, fruits, whole grains, low-fat dairy products, and lean protein foods contain the nutrients you need without too many calories. Decrease your intake of foods high in solid fats, added sugars, and salt. Eat the right amount of calories for you. Get information about a proper diet from your health care provider, if necessary.  . Regular physical exercise is  one of the most important things you can do for your health. Most adults should get at least 150 minutes of moderate-intensity exercise (any activity that increases your heart rate and causes you to sweat) each week. In addition, most adults need muscle-strengthening exercises on 2 or more days a week.  Silver Sneakers may be a benefit available to you. To determine eligibility, you may visit the website: www.silversneakers.com or contact program at (787)[phone] Mon-Fri between 8AM-8PM.   . Maintain a healthy weight. The body mass index (BMI) is a screening tool to identify possible weight problems. It provides an estimate of body fat based on height and weight. Your health care provider can find your BMI and can help you achieve or maintain a healthy weight.   For adults 20 years and older: ? A BMI below 18.5 is considered underweight. ? A BMI of 18.5 to 24.9 is normal. ? A BMI of 25 to 29.9 is considered overweight. ? A BMI of 30 and above is considered obese.   . Maintain normal blood lipids and cholesterol levels by exercising and minimizing your intake of saturated fat. Eat a balanced diet with plenty of fruit and vegetables. Blood tests for lipids and cholesterol should begin at age 75 and be repeated every 5 years. If your lipid or cholesterol levels are high, you are over 50, or you are at high risk for heart disease, you may need  your cholesterol levels checked more frequently. Ongoing high lipid and cholesterol levels should be treated with medicines if diet and exercise are not working.  . If you smoke, find out from your health care provider how to quit. If you do not use tobacco, please do not start.  . If you choose to drink alcohol, please do not consume more than 2 drinks per day. One drink is considered to be 12 ounces (355 mL) of beer, 5 ounces (148 mL) of wine, or 1.5 ounces (44 mL) of liquor.  . If you are 60-71 years old, ask your health care provider if you should take  aspirin to prevent strokes.  . Use sunscreen. Apply sunscreen liberally and repeatedly throughout the day. You should seek shade when your shadow is shorter than you. Protect yourself by wearing long sleeves, pants, a wide-brimmed hat, and sunglasses year round, whenever you are outdoors.  . Once a month, do a whole body skin exam, using a mirror to look at the skin on your back. Tell your health care provider of new moles, moles that have irregular borders, moles that are larger than a pencil eraser, or moles that have changed in shape or color.

## 2017-08-01 ENCOUNTER — Encounter: Payer: Medicare Other | Admitting: Primary Care

## 2017-08-01 ENCOUNTER — Ambulatory Visit (INDEPENDENT_AMBULATORY_CARE_PROVIDER_SITE_OTHER): Payer: Medicare Other | Admitting: Primary Care

## 2017-08-01 ENCOUNTER — Telehealth: Payer: Self-pay

## 2017-08-01 VITALS — BP 122/72 | HR 81 | Temp 98.8°F | Ht 60.25 in | Wt 106.8 lb

## 2017-08-01 DIAGNOSIS — E785 Hyperlipidemia, unspecified: Secondary | ICD-10-CM | POA: Diagnosis not present

## 2017-08-01 DIAGNOSIS — E119 Type 2 diabetes mellitus without complications: Secondary | ICD-10-CM | POA: Diagnosis not present

## 2017-08-01 DIAGNOSIS — Z794 Long term (current) use of insulin: Principal | ICD-10-CM

## 2017-08-01 DIAGNOSIS — Z23 Encounter for immunization: Secondary | ICD-10-CM

## 2017-08-01 MED ORDER — GLIPIZIDE ER 5 MG PO TB24: 5.0000 mg | ORAL_TABLET | Freq: Every day | ORAL | 1 refills | Status: DC

## 2017-08-01 NOTE — Addendum Note (Signed)
Addended by: Tawnya CrookSAMBATH, Winifred Bodiford on: 08/01/2017 01:11 PM   Modules accepted: Orders

## 2017-08-01 NOTE — Assessment & Plan Note (Addendum)
Recent A1C stable at 7.2. Continue Glipizide XL 5 mg, Metformin 850 mg BID, Humulin NPH 8 units BID. Foot exam unremarkable today. Endorses Prevnar 13 completed. Managed on statin and ACE. Repeat A1C in 6 months. Did discuss low readings with both she and her son. They are to report if readings are trending down to the 70's-80's range. At that point would stop Glipizide.

## 2017-08-01 NOTE — Patient Instructions (Signed)
Continue all medications as prescribed.  You were provided with a pneumonia vaccination today.  Increase consumption of vegetables, fruit, whole grains.  Ensure you are consuming 64 ounces of water daily.  Start exercising. You should be getting 150 minutes of exercise weekly.  Follow up in 6 months for diabetes check or sooner if needed.  It was a pleasure to see you today!

## 2017-08-01 NOTE — Progress Notes (Signed)
Subjective:    Patient ID: Holly Trevino, female    DOB: 17-Apr-1941, 76 y.o.   MRN: [id number]  HPI  Holly Trevino is a 76 year old female who presents today for [place] Part 2. She saw our health advisor last week. Her mammogram and foot exam are due. She declines mammogram today. She endorses completing Prevnar 13, 5 years ago. She did fracture her right tib/fib in February 2018, doing well since fall and fracture.  1) Type 2 Diabetes: Currently managed on Glipizide XL 5 mg, Humulin NPH 8 units BID, and Metformin 850 mg BID. Recent A1C of 7.2 which is an improvement form 8.0 six months prior. She denies dizziness, numbness/tingling. Overall feeling well. Fasting blood sugars are averaging 90's to low 100's with a few low's in the 70's-80's.   2) Hyperlipidemia: Currently managed on simvastatin 20 mg. Recent lipid panel stable. Denies myalgias.   Review of Systems  Constitutional: Negative for unexpected weight change.  HENT: Negative for rhinorrhea.   Respiratory: Negative for cough and shortness of breath.   Cardiovascular: Negative for chest pain.  Gastrointestinal: Negative for constipation and diarrhea.  Genitourinary: Negative for difficulty urinating and menstrual problem.  Musculoskeletal: Negative for arthralgias and myalgias.  Skin: Negative for rash.  Allergic/Immunologic: Negative for environmental allergies.  Neurological: Negative for dizziness, numbness and headaches.  Psychiatric/Behavioral:       Denies concerns for anxiety or depression       Past Medical History:  Diagnosis Date  . Dizziness   . Hyperlipidemia   . Type 2 diabetes mellitus ([place])      Social History   Social History  . Marital status: Married    Spouse name: N/A  . Number of children: N/A  . Years of education: N/A   Occupational History  . Not on file.   Social History Main Topics  . Smoking status: Never Smoker  . Smokeless tobacco: Never Used  . Alcohol use No  . Drug use: No  . Sexual  activity: Not on file   Other Topics Concern  . Not on file   Social History Narrative   Moved from [place].   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   Enjoys reading books, spending time with family.    No past surgical history on file.  Family History  Problem Relation Age of Onset  . Lung cancer Father   . Diabetes Sister     No Known Allergies  Current Outpatient Prescriptions on File Prior to Visit  Medication Sig Dispense Refill  . Blood Glucose Monitoring Suppl (ONETOUCH VERIO IQ SYSTEM) w/Device KIT Use as instructed to test blood sugar up to 2 times daily. 1 kit 0  . glipiZIDE (GLUCOTROL XL) 5 MG 24 hr tablet Take 1 tablet (5 mg total) by mouth daily with breakfast. 90 tablet 3  . glucose blood (ONETOUCH VERIO) test strip Use as instructed to test blood sugar 2 times daily 300 each 2  . Insulin NPH, Human,, Isophane, (HUMULIN N KWIKPEN) 100 UNIT/ML Kiwkpen Inject 8 units into the skin 2 times daily. 45 mL 2  . Insulin Pen Needle 31G X 8 MM MISC Used as directed to inject Humulin insulin 2 times daily before meals. Dx E11.9 200 each 1  . lisinopril (PRINIVIL,ZESTRIL) 10 MG tablet Take 1 tablet (10 mg total) by mouth daily. 90 tablet 3  . metFORMIN (GLUCOPHAGE) 850 MG tablet Take 1 tablet (850 mg total) by mouth 2 (two) times  daily with a meal. 180 tablet 3  . ONETOUCH DELICA LANCETS FINE MISC Check blood sugar twice a day and as directed; insulin treatment Dx E11.9 300 each 1  . simvastatin (ZOCOR) 20 MG tablet Take 1 tablet (20 mg total) by mouth daily. 90 tablet 3   No current facility-administered medications on file prior to visit.     BP 122/72   Pulse 81   Temp 98.8 F (37.1 C) (Oral)   Ht 5' 0.25" (1.53 m)   Wt 106 lb 12.8 oz (48.4 kg)   SpO2 98%   BMI 20.69 kg/m    Objective:   Physical Exam  Constitutional: She is oriented to person, place, and time. She appears well-nourished.  HENT:  Right Ear: Tympanic membrane and ear canal normal.  Left  Ear: Tympanic membrane and ear canal normal.  Nose: Nose normal.  Mouth/Throat: Oropharynx is clear and moist.  Eyes: Pupils are equal, round, and reactive to light. Conjunctivae and EOM are normal.  Neck: Neck supple. No thyromegaly present.  Cardiovascular: Normal rate and regular rhythm.   No murmur heard. Pulmonary/Chest: Effort normal and breath sounds normal. She has no rales.  Abdominal: Soft. Bowel sounds are normal. There is no tenderness.  Musculoskeletal: Normal range of motion.  Lymphadenopathy:    She has no cervical adenopathy.  Neurological: She is alert and oriented to person, place, and time. She has normal reflexes. No cranial nerve deficit.  Skin: Skin is warm and dry. No rash noted.  Psychiatric: She has a normal mood and affect.          Assessment & Plan:

## 2017-08-01 NOTE — Assessment & Plan Note (Signed)
Recent lipid panel unremarkable. Continue Simvastatin 20 mg.

## 2017-08-01 NOTE — Telephone Encounter (Signed)
Holly HuaDavid said pt only has one pill of glipizide left; no longer uses [facility] and [facility] [address]; advised done per protocol. Pt was seen earlier today.

## 2017-08-21 ENCOUNTER — Telehealth: Payer: Self-pay

## 2017-08-21 NOTE — Telephone Encounter (Signed)
Patient should be taking glipizide once daily, metformin twice daily, Humulin insulin 8 units twice daily. It looks like her glipizide was refilled on 08/01/2017 with instructions to take once daily.   How long has she been taking 2 tablets glipizide daily?

## 2017-08-21 NOTE — Telephone Encounter (Signed)
Holly Trevino (DPR signed) left v/m; pt has been taking glipizide 5 mg tabs; pt taking 2 tabs per day. pts bottle has take 1 pill daily. Holly Trevino wants to know how many pills pt should be taking. Pt last seen 08/01/17.

## 2017-08-21 NOTE — Telephone Encounter (Signed)
Son advised.  Son is not sure how long she has been taking Glipizide 2 tablets daily.  Patient says since March but I'm surprised that the insurance has not caught it for an early refill.

## 2017-08-21 NOTE — Telephone Encounter (Signed)
Would recommend she take glipizide once daily as prescribed. Continue metformin twice daily and Humulin 8 units twice daily.

## 2017-08-22 NOTE — Telephone Encounter (Signed)
Spoken and notified patient's son of Kate's comments. Patient's son  verbalized understanding.   

## 2017-12-15 IMAGING — DX DG ANKLE COMPLETE 3+V*R*
3 series · 3 of 3 positions shown · non-contrast
Comparison: None.

CLINICAL DATA: Fall on ice today.  Fracture.  Initial encounter.

EXAM:
RIGHT ANKLE - COMPLETE 3+ VIEW

[x ankle lat right]
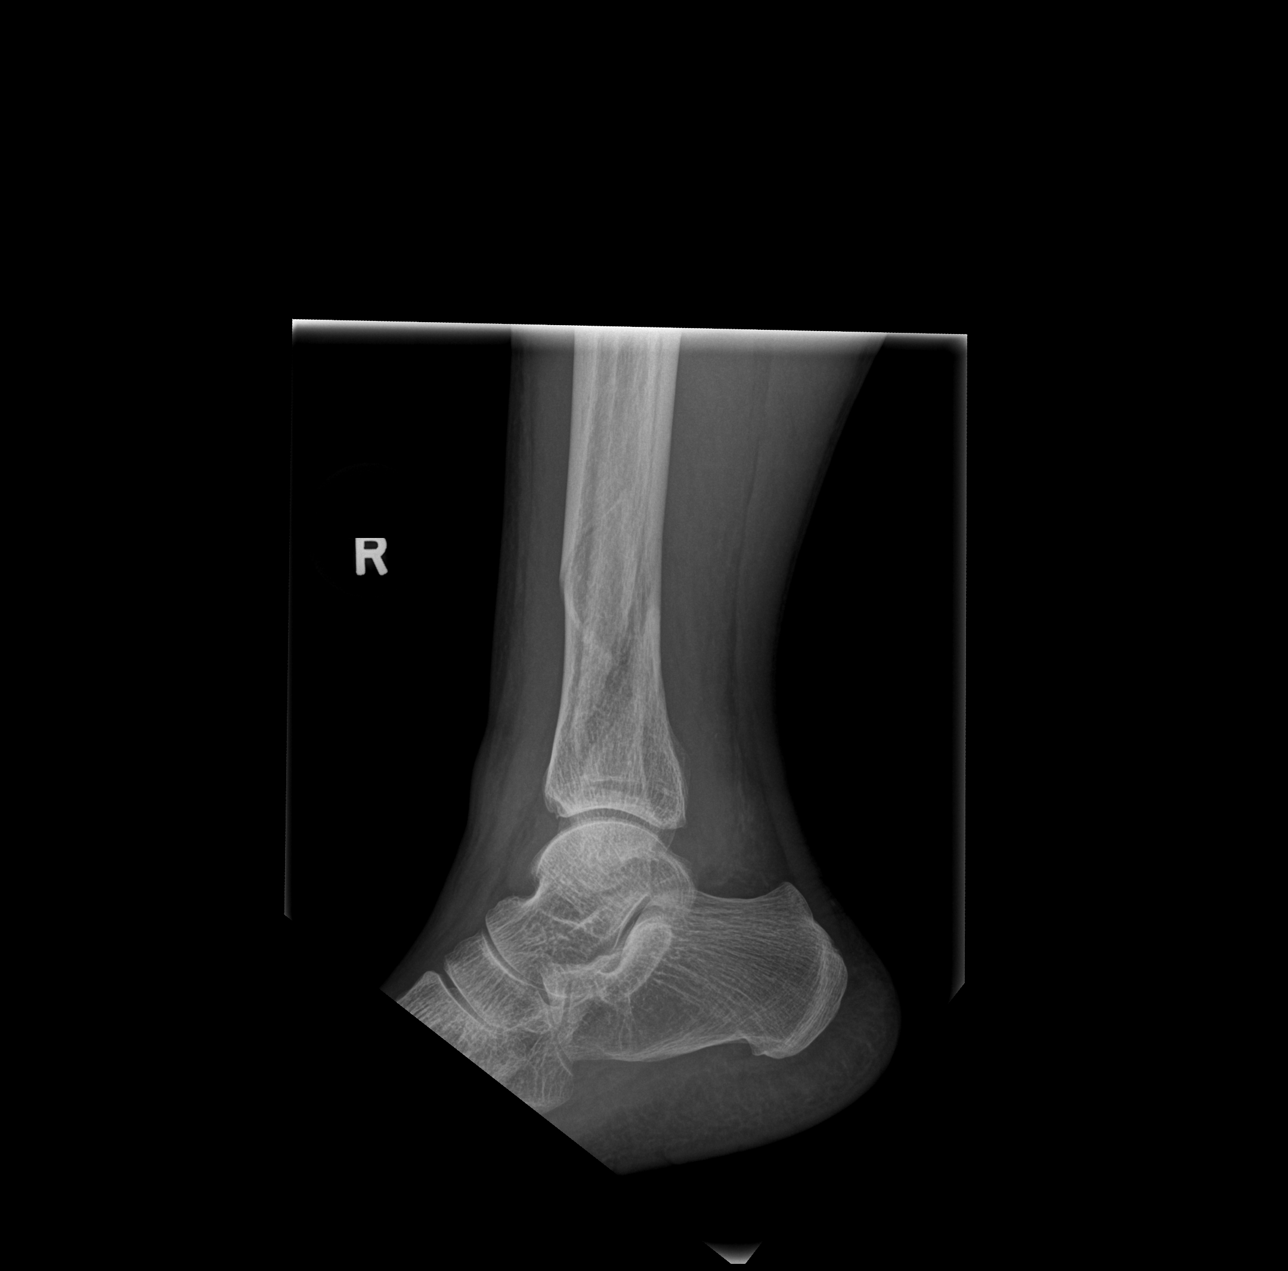

[x ankle ap right]
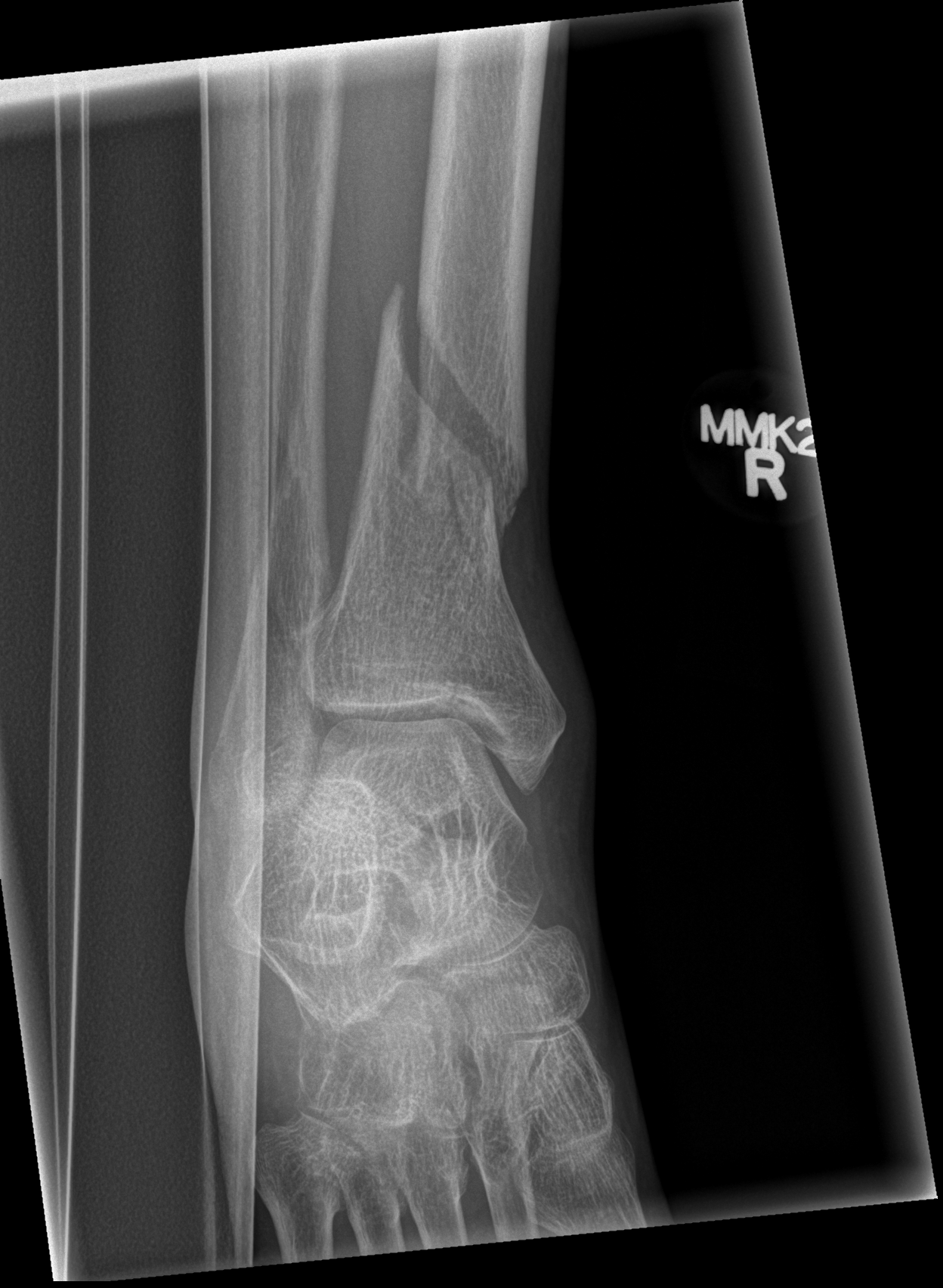

[x ankle obl right]
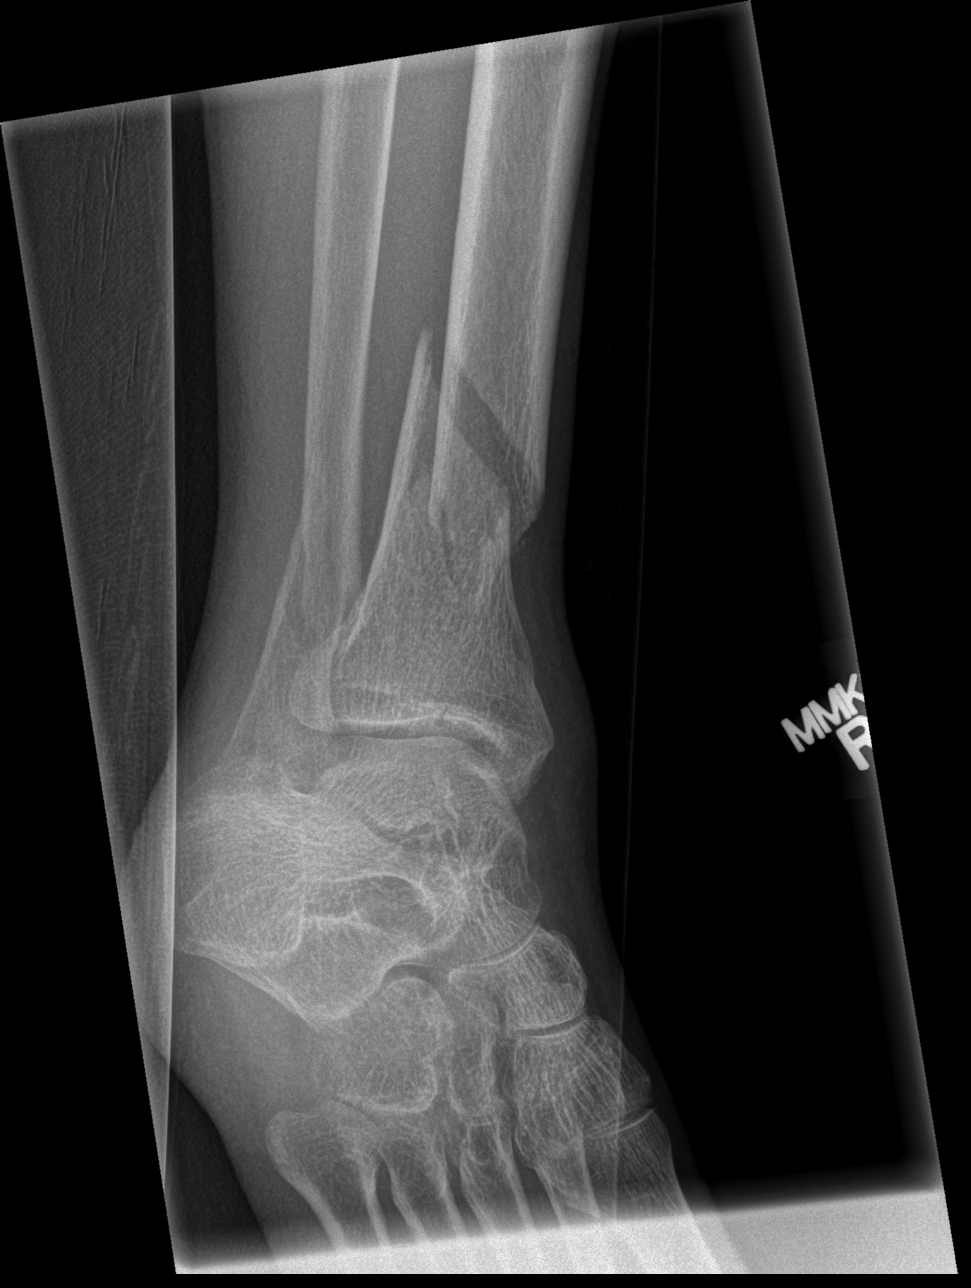

[3 of 3 positions shown; findings below may reference images not displayed]

FINDINGS: There is a spiral fracture of the distal tibial diaphysis with 25%
lateral displacement. Questionable branching fracture plane
extending to the plafond, without articular surface displacement.
Coronal oblique fracture of the distal fibular diaphysis with mild
apex ventral angulation. Osteopenia.
IMPRESSION: 1. Mildly displaced spiral fracture of the distal tibial diaphysis.
Questionable extension to the plafond.
2. Nondisplaced distal fibular diaphysis fracture.

## 2017-12-15 IMAGING — CT CT ANKLE*R* W/O CM
3 of 4 series · 13 of 33 positions shown, 16 images · non-contrast
Comparison: Right ankle 01/29/2017

CLINICAL DATA: Fractures of the distal tibia and fibula after slip
and fall on the ice this morning.

EXAM:
CT OF THE RIGHT ANKLE WITHOUT CONTRAST
TECHNIQUE: Multidetector CT imaging of the right ankle was performed according
to the standard protocol. Multiplanar CT image reconstructions were
also generated.

[Series 3: lower ext 3.0 u90u · axial · 0.29mm/px · z∈[+304,+424]mm · 5 of 60 slices shown, 7 images]
[im 10/60  soft-tissue]
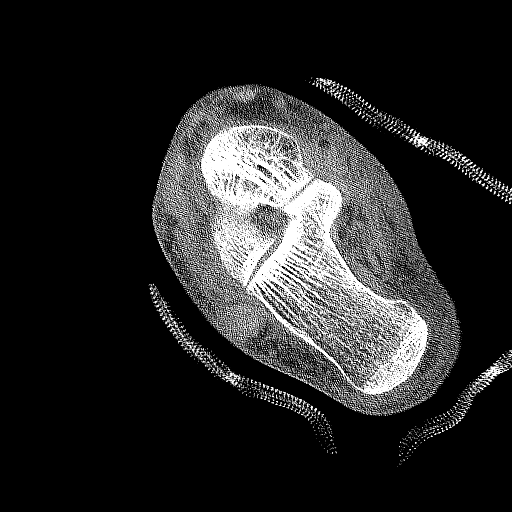
[im 10/60  bone]
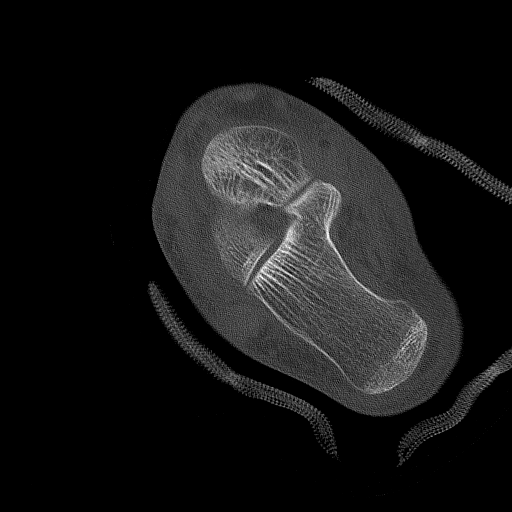
[im 20/60  bone]
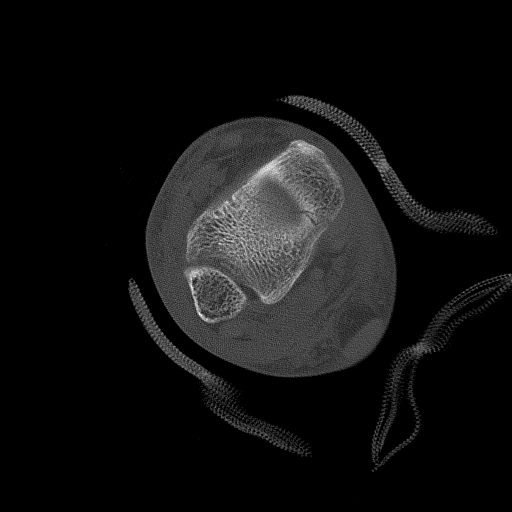
[im 30/60  bone]
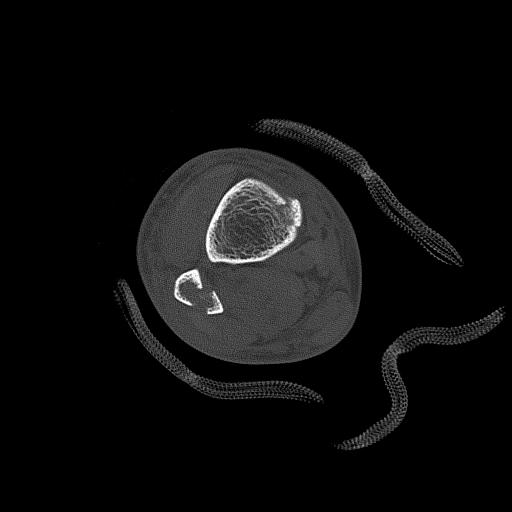
[im 40/60  bone]
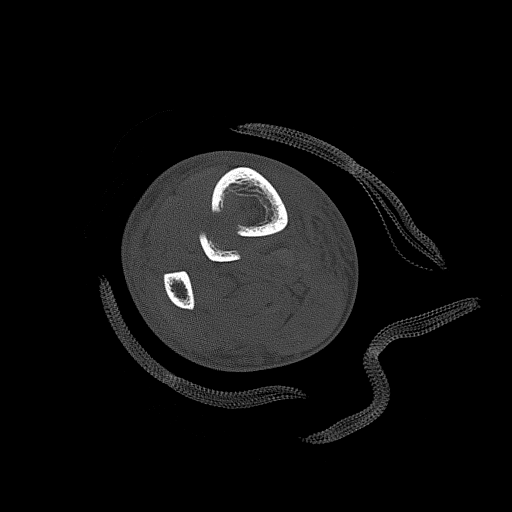
[im 50/60  soft-tissue]
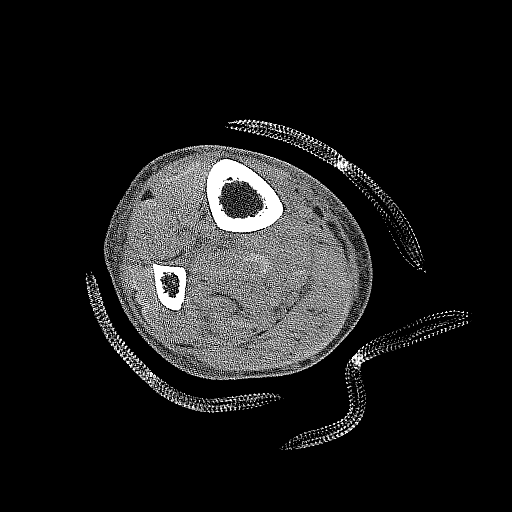
[im 50/60  bone]
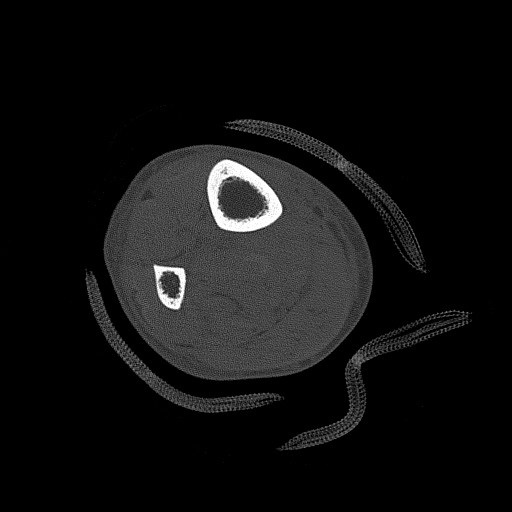

[Series 7: coronal soft tissue · coronal · 0.29mm/px · 3 of 60 slices shown]
[im 24/60  bone]
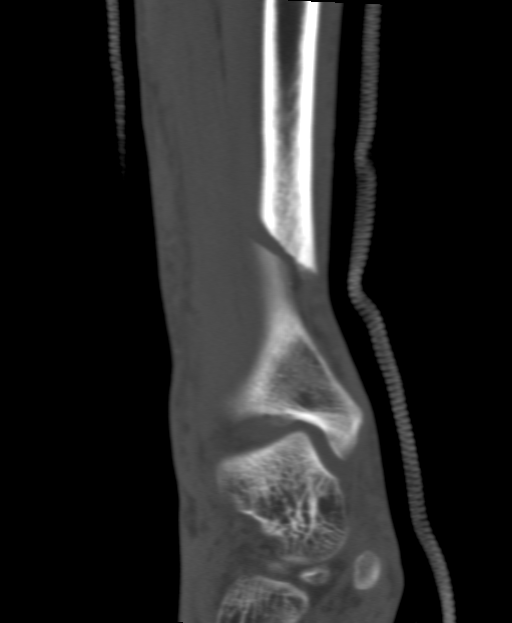
[im 36/60  bone]
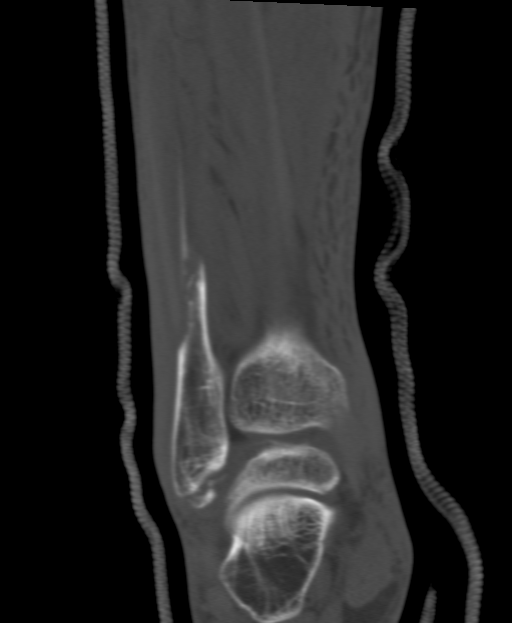
[im 48/60  bone]
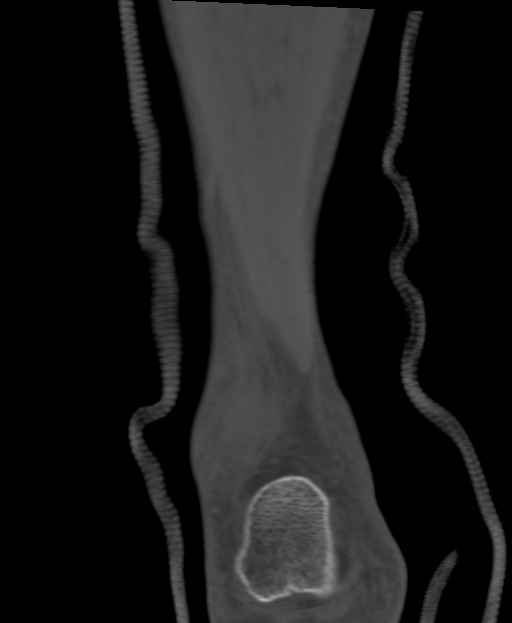

[Series 8: sagittal soft tissue · sagittal · 0.25mm/px · 5 of 58 slices shown, 6 images]
[im 20/58  bone]
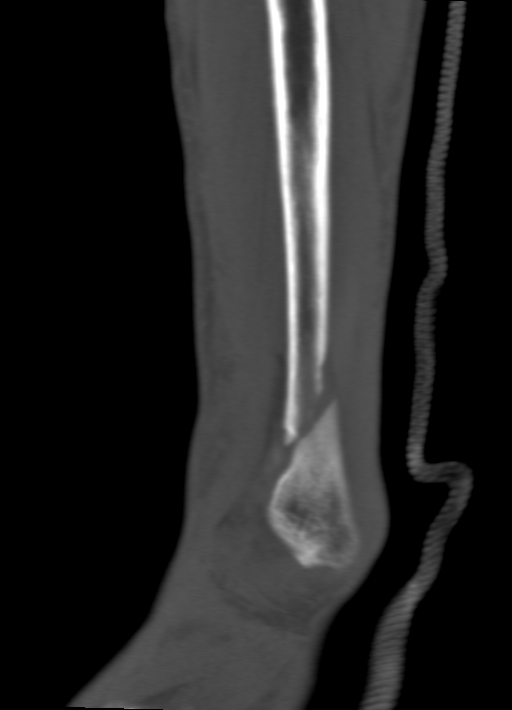
[im 24/58  bone]
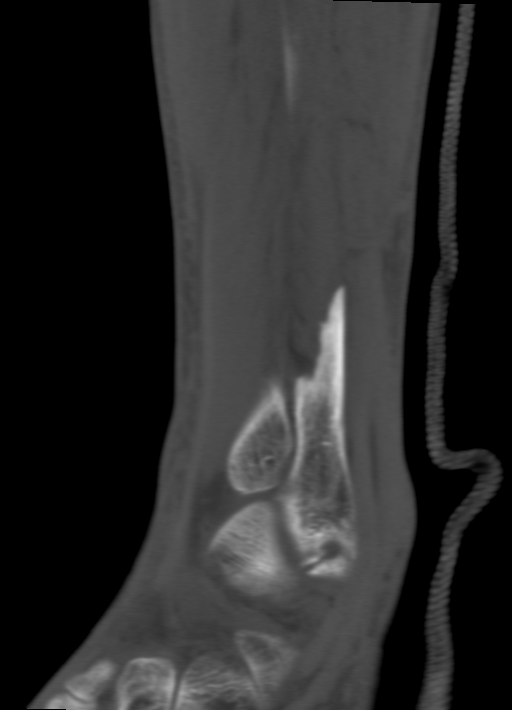
[im 29/58  soft-tissue]
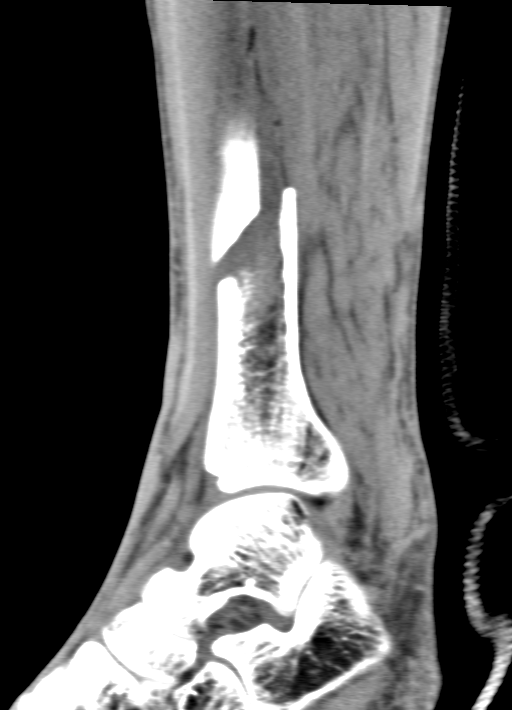
[im 29/58  bone]
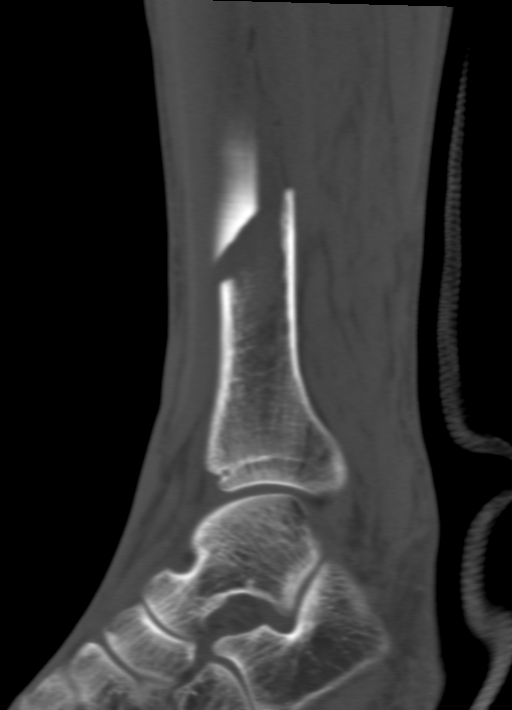
[im 34/58  bone]
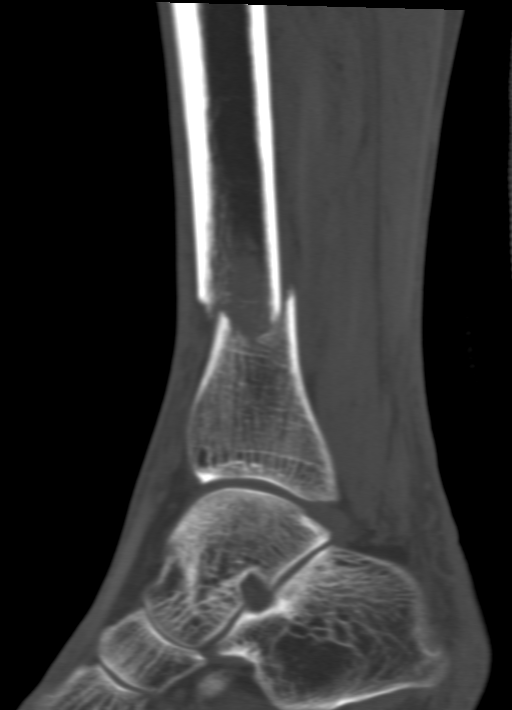
[im 39/58  bone]
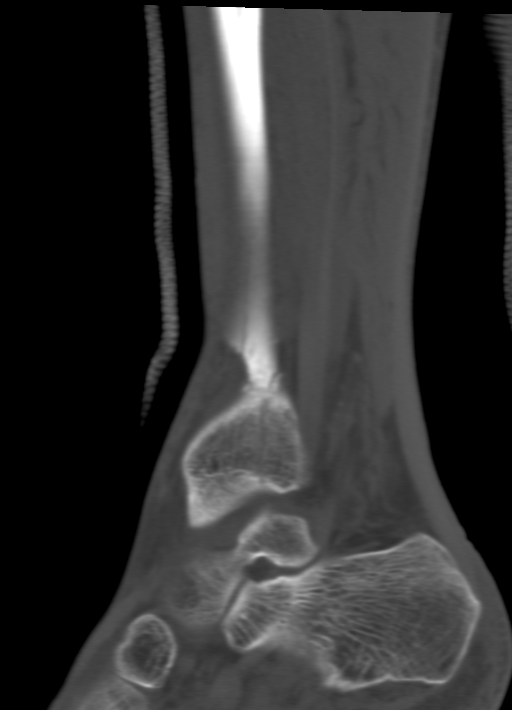

[13 of 33 positions shown; findings below may reference images not displayed]

FINDINGS: Bones/Joint/Cartilage

Oblique fracture of the metadiaphysis of the distal right tibia with
about 1 cm lateral displacement and 8 mm posterior displacement of
the distal fracture fragment. There is an additional fracture line
across the base of the medial malleolus of the right ankle without
displacement. The fracture line extends to the tibiotalar joint
surface. Mild widening of the joint space may indicate ligamentous
injury. Oblique comminuted fracture of the distal right fibular
shaft with extension to the tibia fibular joint. Comminuted
fractures of the tip of the fibula consistent with avulsion of the
lateral malleolus. Fracture lines extends to the talofibular joint.

Ligaments

Suboptimally assessed by CT.

Muscles and Tendons

Limited evaluation. Appear grossly intact. MRI would be more
suitable for evaluation of muscles and tendons.

Soft tissues

Diffuse soft tissue edema about the ankle. No loculated collections.
IMPRESSION: Multiple fractures of the distal tibia and fibula as described
above. Diffuse soft tissue edema.

## 2017-12-20 ENCOUNTER — Telehealth: Payer: Self-pay | Admitting: Primary Care

## 2017-12-20 ENCOUNTER — Other Ambulatory Visit: Payer: Self-pay | Admitting: Primary Care

## 2017-12-20 DIAGNOSIS — I10 Essential (primary) hypertension: Secondary | ICD-10-CM

## 2017-12-20 MED ORDER — GLUCOSE BLOOD VI STRP: ORAL_STRIP | 2 refills | Status: DC

## 2017-12-20 NOTE — Telephone Encounter (Signed)
Copied from CRM [phone]. Topic: Quick Communication - See Telephone Encounter >> Dec 20, 2017  2:06 PM Arlyss Gandyichardson, Caili Escalera N, NT wrote: CRM for notification. See Telephone encounter for: Pt needing a refill of glucose blood (ONETOUCH VERIO) test strip. [facility] [address] Please call Onalee HuaDavid once refilled.  12/20/17.

## 2017-12-21 ENCOUNTER — Other Ambulatory Visit: Payer: Self-pay | Admitting: Primary Care

## 2017-12-22 ENCOUNTER — Telehealth: Payer: Self-pay | Admitting: Primary Care

## 2017-12-22 ENCOUNTER — Other Ambulatory Visit: Payer: Self-pay | Admitting: Primary Care

## 2017-12-22 DIAGNOSIS — Z794 Long term (current) use of insulin: Principal | ICD-10-CM

## 2017-12-22 DIAGNOSIS — E119 Type 2 diabetes mellitus without complications: Secondary | ICD-10-CM

## 2017-12-22 MED ORDER — INSULIN PEN NEEDLE 31G X 8 MM MISC: 1 refills | Status: DC

## 2017-12-22 MED ORDER — METFORMIN HCL 850 MG PO TABS: 850.0000 mg | ORAL_TABLET | Freq: Two times a day (BID) | ORAL | 3 refills | Status: DC

## 2017-12-22 NOTE — Telephone Encounter (Signed)
Copied from CRM [id number]. Topic: Quick Communication - See Telephone Encounter >> Dec 22, 2017  1:54 PM Arlyss Gandyichardson, Emilene Roma N, NT wrote: CRM for notification. See Telephone encounter for: Pt needing a refill of Metformin and pin needles. [facility] [address]  12/22/17.

## 2017-12-22 NOTE — Telephone Encounter (Signed)
Metformin last refill 02/15/17 180# with 3 refills. [facility] and pharmacist said there were no refills left   Pin needles last filled 03/29/17 200# 1 refill.   OV:08/01/17

## 2018-02-01 ENCOUNTER — Ambulatory Visit: Payer: Medicare Other | Admitting: Primary Care

## 2018-02-05 ENCOUNTER — Telehealth: Payer: Self-pay | Admitting: Primary Care

## 2018-02-05 NOTE — Telephone Encounter (Signed)
Copied from CRM [id number]. Topic: Quick Communication - See Telephone Encounter >> Feb 05, 2018  8:35 AM Waymon AmatoBurton, Donna F wrote: CRM for notification. See Telephone encounter for:  Pt son called to ask [facility] [place] [phone]  Best number of patient [phone] 02/05/18.

## 2018-02-05 NOTE — Telephone Encounter (Signed)
Tried to call patient but kept getting busy signal. 

## 2018-02-05 NOTE — Telephone Encounter (Signed)
Noted, has she moved to [place]? Please notify son that [facility] will have to [facility] the prescriptions from her current pharmacy by [facility]

## 2018-02-09 ENCOUNTER — Telehealth: Payer: Self-pay | Admitting: Primary Care

## 2018-02-09 NOTE — Telephone Encounter (Signed)
Prescriptions were not denied as Holly Trevino states below, we just needed clarification about her location. If she's moved to [place] then she'll need to find a local PCP at some point. She was provided with a 6-12 month refill on most all of her medications in December 2018. She'll need [facility] [place] [facility]

## 2018-02-09 NOTE — Telephone Encounter (Signed)
Pts daughter [name] would like a call to speak with Vernona RiegerKatherine Clark regarding her moms medication that her brother requested to be sent to a pharmacy in [place] on Monday. She states she will be filing a complaint about the office to [place]  because this has not been done. She states she will be suing the office if this isn't handled asap. I did inform her that Johny DrillingChan had been trying to contact her mom and she states that her mom cannot speak english and should have not been contacted. She states that her brother was told the request was denied, but this is not documented in the chart that it was denied and I did let her know that. She would like a call asap.

## 2018-02-09 NOTE — Telephone Encounter (Signed)
On call provider contacted by after hours service by pt's daughter in regards to the pt's medication refills. [place] now living in [place], [place] and needs meds refilled. Pt's daughter is unsure as to what meds pt is on.  Daughter demanding on call provider contact her personally to apologize.  Daughter threatening to report everyone involved.  Per chart review. Multiple attempts were made to reach pt on 02/05/18. It appears from the msg on 02/09/18, that the [facility] needs [facility] [place] [facility] [address] to have RXs transferred    Details including [facility] were given to the after hours operator. Pt should establish with a new provider in her area.   Update: at 10:49 pm Call service contacted, previously asked for a list of pt's medications, however 2/2 issues connecting with [place] [facility] unable to provide.  Call service agent Melissa contacted and given list of pt's meds with dose and # of refills.  Agent plans to call the pharmacy in the am in regards to transferring meds as pt's daughter stating the pharmacy in [place] will only transfer 1 rx, not multiple scripts.   Abbe AmsterdamShannon Wauneta Silveria, MD

## 2018-02-09 NOTE — Telephone Encounter (Signed)
It looks like some of her [facility] and others are [facility]. Looks like she may be running low on her insulin ([facility]) and [facility]ip[facility]ide ([facility]).   She is actually due for a diabetes recheck as mentioned in her after visit summary from her visit in August 2018. If [place]'s moved to [place] then I will provide her with a three month refill for both medications until she can establish with a new PCP. Let me know when you've spoken with the family and ensure they understand. Please find out if she's moved.

## 2018-02-09 NOTE — Telephone Encounter (Addendum)
LM returning Holly Trevino's call (okay per DPR)  to clear up confusion from previous call discussion.  #1.  Prescriptions HAVE NOT BEEN denied, we just need clarification around patient's current living status.  #2.  [facility]'s.  #3. CMA was given a number to call back [phone]) by the brother on Monday.      I apologized to [name] for any delay or confusion on the VM and told her that K. Clark's CMA had tried calling back on Monday to discuss and only received a busy signal on multiple attempts.    [name] encouraged to call me back this Monday to discuss further as message came in from call center after hours and it would be Monday before we can get back in touch.

## 2018-02-12 ENCOUNTER — Telehealth: Payer: Self-pay

## 2018-02-12 DIAGNOSIS — E119 Type 2 diabetes mellitus without complications: Secondary | ICD-10-CM

## 2018-02-12 DIAGNOSIS — E785 Hyperlipidemia, unspecified: Secondary | ICD-10-CM

## 2018-02-12 DIAGNOSIS — I10 Essential (primary) hypertension: Secondary | ICD-10-CM

## 2018-02-12 DIAGNOSIS — Z794 Long term (current) use of insulin: Principal | ICD-10-CM

## 2018-02-12 MED ORDER — GLIPIZIDE ER 5 MG PO TB24
5.0000 mg | ORAL_TABLET | Freq: Every day | ORAL | 0 refills | Status: AC
Start: 2018-02-12 — End: ?

## 2018-02-12 MED ORDER — LISINOPRIL 10 MG PO TABS: 10.0000 mg | ORAL_TABLET | Freq: Every day | ORAL | 0 refills | Status: AC

## 2018-02-12 MED ORDER — ONETOUCH DELICA LANCETS FINE MISC: 0 refills | Status: AC

## 2018-02-12 MED ORDER — SIMVASTATIN 20 MG PO TABS: 20.0000 mg | ORAL_TABLET | Freq: Every day | ORAL | 0 refills | Status: AC

## 2018-02-12 MED ORDER — ONETOUCH VERIO IQ SYSTEM W/DEVICE KIT: PACK | 0 refills | Status: AC

## 2018-02-12 MED ORDER — INSULIN PEN NEEDLE 31G X 8 MM MISC: 0 refills | Status: AC

## 2018-02-12 MED ORDER — INSULIN PEN NEEDLE 31G X 8 MM MISC: 1 refills | Status: AC

## 2018-02-12 MED ORDER — METFORMIN HCL 850 MG PO TABS: 850.0000 mg | ORAL_TABLET | Freq: Two times a day (BID) | ORAL | 0 refills | Status: AC

## 2018-02-12 MED ORDER — INSULIN ISOPHANE HUMAN 100 UNIT/ML KWIKPEN: PEN_INJECTOR | SUBCUTANEOUS | 0 refills | Status: AC

## 2018-02-12 MED ORDER — GLUCOSE BLOOD VI STRP: ORAL_STRIP | 0 refills | Status: AC

## 2018-02-12 NOTE — Telephone Encounter (Signed)
PLEASE NOTE: All timestamps contained within this report are represented as [place] Standard Time. CONFIDENTIALTY NOTICE: This fax transmission is intended only for the addressee. It contains information that is legally privileged, confidential or otherwise protected from use or disclosure. If you are not the intended recipient, you are strictly prohibited from reviewing, disclosing, copying using or disseminating any of this information or taking any action in reliance on or regarding this information. If you have received this fax in error, please notify us immediately by telephone so that we can arrange for its return to [place]. Phone: [phone], Toll-Free: [phone], Fax: [phone] Page: 1 of 3 Call Id: [id number] [facility] Night - Client TELEPHONE ADVICE RECORD [facility] Patient Name: Holly Trevino Gender: Female DOB: 1941/05/15 Age: 77 Y 6 M 1 D Return Phone Number: [phone] (Primary) Address: City/State/Zip: [place] [address] Client [facility] Night - Client Client Site [facility] - Night Physician Vernona Rieger - NP Contact Type Call Who Is Calling Patient / Member / Family / Caregiver Call Type Triage / Clinical Caller Name [name] Relationship To Patient Daughter Return Phone Number [phone] (Primary) Chief Complaint Prescription Refill or Medication Request (non symptomatic) Reason for Call Request to Speak to a Physician Initial Comment Caller states fifth phone call to request prescription for diabetic patient. Needs to be sent right away to [place] facility. Have called several times, two this day. Patient now at [place]. [facility] Translation No Nurse Assessment Guidelines Guideline Title Affirmed Question Affirmed Notes Nurse Date/Time (Eastern Time) Disp. Time Lamount Cohen Time) Disposition Final User 02/09/2018 7:01:38 PM Called On-Call  Provider Zayas, RN, Melissa 02/09/2018 7:20:38 PM Call Completed Pollyann Savoy, RN, [facility] 02/09/2018 7:42:54 PM Called On-Call Provider Zayas, RN, Melissa 02/09/2018 7:47:03 PM Call Completed Pollyann Savoy, RN, Melissa 02/10/2018 11:23:40 AM Called On-Call Provider Zayas, RN, Melissa 02/10/2018 11:27:32 AM Send To RN Personal Zayas, RN, Melissa 02/10/2018 11:35:28 AM Paged On Call back to [facility], RN, [facility] 02/10/2018 11:48:20 AM Send To RN Personal Zayas, RN, Melissa 02/10/2018 12:08:18 PM Called On-Call Provider Zayas, RN, Melissa 02/10/2018 12:10:33 PM Call Completed Pollyann Savoy, RN, Melissa 02/10/2018 12:29:56 PM Call Completed Pollyann Savoy, RN, Melissa 02/09/2018 7:20:50 PM Clinical Call Yes Pollyann Savoy, RN, Melissa Comments User: Susa Loffler, RN Date/Time Lamount Cohen Time): 02/09/2018 7:21:50 PM PLEASE NOTE: All timestamps contained within this report are represented as [place] Standard Time. CONFIDENTIALTY NOTICE: This fax transmission is intended only for the addressee. It contains information that is legally privileged, confidential or otherwise protected from use or disclosure. If you are not the intended recipient, you are strictly prohibited from reviewing, disclosing, copying using or disseminating any of this information or taking any action in reliance on or regarding this information. If you have received this fax in error, please notify us immediately by telephone so that we can arrange for its return to [place]. Phone: [phone], Toll-Free: [phone], Fax: [phone] Page: 2 of 3 Call Id: [id number] Comments Returned call to dtr -VM left will return call in a few min. User: Susa Loffler, RN Date/Time Lamount Cohen Time): 02/09/2018 7:49:19 PM Returned call to dtr and informed this nurse to call MD tomorrow after 1pm to see what can be done for refills. User: Susa Loffler, RN Date/Time Lamount Cohen Time): 02/10/2018 11:02:19 AM Call to [place], [place] ph# [phone]-spoke with pharmacist- User:  Susa Loffler, RN Date/Time Lamount Cohen Time): 02/10/2018 12:29:50 PM Returned call to daughter-informed to please  have pharmacist call [facility] to transfer 30 day fill across state lines. Questions answereddtr verbalizes understanding/appr. Dtr states she wants more than 30 day fills-explained protocol for scripts across state lines pharmacist to pharmacist. Paging DoctorName Phone DateTime Result/Outcome Message Type Notes Abbe AmsterdamBanks, Shannon- MD [phone] 02/09/2018 7:01:38 PM Called On Call Provider - Reached Doctor Paged Abbe AmsterdamBanks, Shannon- MD 02/09/2018 7:20:28 PM Spoke with On Call - General Message Result Spoke with Dr banks-Informed dtr's request for refills for diabetic meds and supplies [facility] [place] few days ago. Per Dr Desma McgregorBanks-pt was provided with 6-12 mo refills on all meds Dec 2018 and only needs to [facility] [place] [facility]Abbe Amsterdam. Banks, Shannon- MD [phone] 02/09/2018 7:42:53 PM Called On Call Provider - Reached Doctor Paged Abbe AmsterdamBanks, Shannon- MD 02/09/2018 7:46:45 PM Spoke with On Call - General Message Result Spoke with Dr Luther BradleyBanks-informed dtr states [facility] states can only receive 1 med across state lines and if more than one med MD must call in medspharmacy is closed for this nurse to verify dtr's information. Dr Salomon FickBanks states she will try to call nurse back if can get into record tonight or this nurse to call [facility] office tomorrow after 1pm tomorrow. Abbe AmsterdamBanks, Shannon- MD [phone] 02/10/2018 11:23:40 AM Called On Call Provider - Reached Doctor Paged Abbe AmsterdamBanks, Shannon- MD 02/10/2018 11:26:56 AM Spoke with On Call - General Message Result Spoke with Dr Sherolyn BubaBanks-informed pharmacuy requests e-scripts-order nurse may call in meds but unable to escript across state lines but nurse may call in meds to pharm Abbe AmsterdamBanks, Shannon- MD [phone] 02/10/2018 11:35:28 AM Called On Call Provider - Left Message Doctor Paged Abbe AmsterdamBanks,  Shannon- MD [phone] 02/10/2018 12:08:18 PM Called On Call Provider - Reached Doctor Paged PLEASE NOTE: All timestamps contained within this report are represented as [place] Standard Time. CONFIDENTIALTY NOTICE: This fax transmission is intended only for the addressee. It contains information that is legally privileged, confidential or otherwise protected from use or disclosure. If you are not the intended recipient, you are strictly prohibited from reviewing, disclosing, copying using or disseminating any of this information or taking any action in reliance on or regarding this information. If you have received this fax in error, please notify us immediately by telephone so that we can arrange for its return to us. Phone: [phone], Toll-Free: [phone], Fax: [phone] Page: 3 of 3 Call Id: [id number] Paging DoctorName Phone DateTime Result/Outcome Message Type Notes Abbe AmsterdamBanks, Shannon- MD 02/10/2018 12:09:32 PM Spoke with On Call - General Message Result Spoke with Dr Salomon FickBanks and [facility] states any new rx over state lines even if not controlled substance states must have DEA #. Verbal order Dr Starleen ArmsBanks-pharmacy to call pharm in [place] where scripts were originally sent and have [facility] [place].

## 2018-02-12 NOTE — Telephone Encounter (Signed)
Caller name: Mrs. Nedra HaiLee  Relation to pt: daughter  Call back number: [phone]  [facility] [address], [place], [place] [id number] [phone]   Reason for call:  Daughter checking on the status of message below, informed daughter of PCP recommendation. Daughter did want to ensure all medications will be sent in with a 3 month supply. Daughter did state will find a PCP within the 3 month time frame. Holly Trevino voice understanding and no additional questions at this time.

## 2018-02-12 NOTE — Telephone Encounter (Signed)
Noted  

## 2018-02-12 NOTE — Telephone Encounter (Signed)
Please see other phone encounter regarding this patient's needs. This has been resolved.

## 2018-02-12 NOTE — Telephone Encounter (Signed)
I did call [facility] [place] and was able to confirm that all prescriptions have been received electronically and they are being processed for patient.  Thanks.

## 2018-02-12 NOTE — Telephone Encounter (Signed)
PLEASE NOTE: All timestamps contained within this report are represented as [place] Standard Time. CONFIDENTIALTY NOTICE: This fax transmission is intended only for the addressee. It contains information that is legally privileged, confidential or otherwise protected from use or disclosure. If you are not the intended recipient, you are strictly prohibited from reviewing, disclosing, copying using or disseminating any of this information or taking any action in reliance on or regarding this information. If you have received this fax in error, please notify us immediately by telephone so that we can arrange for its return to us. Phone: [phone], Toll-Free: [phone], Fax: [phone] Page: 1 of 2 Call Id: [id number] [facility] Night - Client TELEPHONE ADVICE RECORD [facility] Patient Name: Holly Trevino Gender: Female DOB: 1941-09-01 Age: 77 Y 6 M 1 D Return Phone Number: [phone] (Primary) Address: [place]State/Zip: [place] [id number] Client [facility] Night - Client [facility] - Night Physician Vernona Riegerlark, Katherine - NP Contact Type Call Who Is Calling Patient / Member / Family / Caregiver Call Type Triage / Clinical Caller Name Chrystie NoseHelen Hettich Relationship To Patient Daughter Return Phone Number [phone] (Primary) Chief Complaint Prescription Refill or Medication Request (non symptomatic) Reason for Call Request to Speak to a Physician Initial Comment Caller states fifth phone call to request prescription for diabetic patient. [place] facility. Have called several times, two this day. [place] now at [place]. [facility] No Nurse Assessment Guidelines Guideline Title Affirmed Question Affirmed Notes Nurse Date/Time (Eastern Time) Disp. Time Lamount Cohen(Eastern Time) Disposition Final User 02/09/2018 7:01:38 PM Called On-Call  Provider Zayas, RN, Melissa 02/09/2018 7:20:38 PM Call Completed Pollyann SavoyZayas, RN, [facility] 02/09/2018 7:42:54 PM Called On-Call Provider Zayas, RN, Melissa 02/09/2018 7:47:03 PM Call Completed Pollyann SavoyZayas, RN, Melissa 02/09/2018 7:20:50 PM Clinical Call Yes Pollyann SavoyZayas, RN, Melissa Comments User: Susa LofflerMelissa, Zayas, RN Date/Time Lamount Cohen(Eastern Time): 02/09/2018 7:21:50 PM Returned call to dtr -VM left will return call in a few min. User: Susa LofflerMelissa, Zayas, RN Date/Time Lamount Cohen(Eastern Time): 02/09/2018 7:49:19 PM Returned call to dtr and informed this nurse to call MD tomorrow after 1pm to see what can be done for refills. PLEASE NOTE: All timestamps contained within this report are represented as [place] Standard Time. CONFIDENTIALTY NOTICE: This fax transmission is intended only for the addressee. It contains information that is legally privileged, confidential or otherwise protected from use or disclosure. If you are not the intended recipient, you are strictly prohibited from reviewing, disclosing, copying using or disseminating any of this information or taking any action in reliance on or regarding this information. If you have received this fax in error, please notify us immediately by telephone so that we can arrange for its return to us. Phone: [phone], Toll-Free: [phone], Fax: [phone] Page: 2 of 2 Call Id: [id number] Paging DoctorName Phone DateTime Result/Outcome Message Type Notes Abbe AmsterdamBanks, Shannon- MD [phone] 02/09/2018 7:01:38 PM Called On Call Provider - Reached Doctor Paged Abbe AmsterdamBanks, Shannon- MD 02/09/2018 7:20:28 PM Spoke with On Call - General Message Result Spoke with Dr banks-Informed dtr's request for refills for diabetic meds and supplies [facility] [place] [facility] days [facility]. Per Dr Desma McgregorBanks-pt was provided with 6-12 mo refills on all meds Dec 2018 and [place] [facility] [place] [facility]Abbe Amsterdam. Banks, Shannon- MD [phone] 02/09/2018 7:42:53 PM Called On  Call Provider - Reached Doctor Paged Abbe AmsterdamBanks, Shannon-  MD 02/09/2018 7:46:45 PM Spoke with On Call - General Message Result Spoke with Dr Luther Bradley dtr states [facility] states can only receive 1 med across state lines and if more than one med MD must call in medspharmacy is closed for this nurse to verify dtr's information. Dr Salomon Fick states she will try to call nurse back if can get into record tonight or this nurse to call [facility] office tomorrow after 1pm tomorrow.

## 2018-02-12 NOTE — Telephone Encounter (Signed)
Holly Tre[place], will you please call daughter and send 3 month supply, no refills for which every medication is needed. Please also notify them that the patient needs to establish with a local PCP in [place] and she's due for a diabetes follow up. Please also see other phone notes and explain the situation.

## 2018-02-12 NOTE — Telephone Encounter (Signed)
Message left for patient's daughter to return my call.

## 2018-02-12 NOTE — Telephone Encounter (Signed)
Spoke with patient's daughter [name] regarding medication needs for her mom.  She was very calm and pleasant and thanked me for our support in resolving this matter.  Patient will need a refill on all of her medications, including testing supplies and meter per patient and daughter.  Daughter is aware that [facility] and that her mom must find a provider in [place] to fill thereafter.    Daughter, [name], thanked me for taking care of this and was given my direct office number to call back if she has any further problems or concerns.

## 2018-05-24 ENCOUNTER — Other Ambulatory Visit: Payer: Self-pay | Admitting: Primary Care

## 2018-05-30 ENCOUNTER — Other Ambulatory Visit: Payer: Self-pay | Admitting: Primary Care

## 2018-07-26 ENCOUNTER — Ambulatory Visit: Payer: Medicare Other

## 2018-08-02 ENCOUNTER — Encounter: Payer: Medicare Other | Admitting: Primary Care

## 2018-08-07 ENCOUNTER — Ambulatory Visit: Payer: Medicare Other

## 2018-08-21 ENCOUNTER — Ambulatory Visit: Payer: Medicare Other

## 2018-08-23 ENCOUNTER — Encounter: Payer: Medicare Other | Admitting: Primary Care

## 2018-09-19 ENCOUNTER — Other Ambulatory Visit: Payer: Self-pay | Admitting: Primary Care

## 2019-08-09 ENCOUNTER — Telehealth: Payer: Self-pay

## 2019-08-09 NOTE — Telephone Encounter (Signed)
pts son Shanon Brow [facility] signed)left v/m requesting if pt had had a shingles shot. Pt is presently in [place]. Due to immunization list I called Shanon Brow back and left detailed v/m per DPR that on pts immunization list there is a Prevnar 13 on 12/27/2011 and pneumovax 23 pneumonia vaccine given on 08/01/2017. That is all the information I have on immunizations and Shanon Brow to cb if needed.

## 2019-08-16 ENCOUNTER — Telehealth: Payer: Self-pay | Admitting: Primary Care

## 2019-08-16 NOTE — Telephone Encounter (Signed)
Notified patient's son that patient never received shingrix vaccine.

## 2019-08-16 NOTE — Telephone Encounter (Signed)
Holly son [place] Trev[place] called today in regards to [place]. They are currently living in [place] and they would like to know the dates that the Holly had this done.   [place] C/B #  [phone]

## 2019-10-08 ENCOUNTER — Other Ambulatory Visit: Payer: Self-pay | Admitting: Primary Care
